# Patient Record
Sex: Female | Born: 1965 | Race: Black or African American | Hispanic: No | Marital: Single | Smoking: Never smoker
Health system: Southern US, Community
[De-identification: ages and names within clinical notes are randomized; demographics above are authoritative.]

## PROBLEM LIST (undated history)

## (undated) HISTORY — PX: KIDNEY STONE SURGERY: SHX686

---

## 2020-10-02 ENCOUNTER — Ambulatory Visit (INDEPENDENT_AMBULATORY_CARE_PROVIDER_SITE_OTHER): Payer: Self-pay | Admitting: Primary Care

## 2020-10-31 ENCOUNTER — Ambulatory Visit (INDEPENDENT_AMBULATORY_CARE_PROVIDER_SITE_OTHER): Payer: Self-pay | Admitting: Primary Care

## 2020-12-11 ENCOUNTER — Ambulatory Visit (INDEPENDENT_AMBULATORY_CARE_PROVIDER_SITE_OTHER): Payer: Self-pay

## 2020-12-11 ENCOUNTER — Ambulatory Visit (HOSPITAL_COMMUNITY)
Admission: EM | Admit: 2020-12-11 | Discharge: 2020-12-11 | Disposition: A | Discharge status: Home / Self Care | Payer: Self-pay | Attending: Family Medicine | Admitting: Family Medicine

## 2020-12-11 ENCOUNTER — Encounter (HOSPITAL_COMMUNITY): Payer: Self-pay

## 2020-12-11 ENCOUNTER — Other Ambulatory Visit: Payer: Self-pay

## 2020-12-11 DIAGNOSIS — M79605 Pain in left leg: Secondary | ICD-10-CM

## 2020-12-11 DIAGNOSIS — M79662 Pain in left lower leg: Secondary | ICD-10-CM

## 2020-12-11 MED ORDER — NAPROXEN 500 MG PO TABS: 500.0000 mg | ORAL_TABLET | Freq: Two times a day (BID) | ORAL | 0 refills | Status: DC | PRN
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
## 2021-01-11 ENCOUNTER — Emergency Department (HOSPITAL_COMMUNITY)
Admission: EM | Admit: 2021-01-11 | Discharge: 2021-01-12 | Disposition: A | Discharge status: Home / Self Care | Payer: 59 | Attending: Emergency Medicine | Admitting: Emergency Medicine

## 2021-01-11 ENCOUNTER — Emergency Department (HOSPITAL_COMMUNITY): Payer: 59

## 2021-01-11 ENCOUNTER — Other Ambulatory Visit: Payer: Self-pay

## 2021-01-11 DIAGNOSIS — M79671 Pain in right foot: Secondary | ICD-10-CM | POA: Diagnosis not present

## 2021-01-11 NOTE — ED Triage Notes (Signed)
Pt said her right foot on the bottom has been having a sore and a tender place that when she walk is bothering her. Pt said painful to walk. No redness noted.

## 2021-01-12 MED ORDER — CIPROFLOXACIN HCL 500 MG PO TABS: 500.0000 mg | ORAL_TABLET | Freq: Two times a day (BID) | ORAL | 0 refills | Status: DC

## 2021-01-12 MED ORDER — CEPHALEXIN 500 MG PO CAPS: 500.0000 mg | ORAL_CAPSULE | Freq: Four times a day (QID) | ORAL | 0 refills | Status: DC

## 2021-01-12 MED ORDER — BACITRACIN ZINC 500 UNIT/GM EX OINT: 1.0000 "application " | TOPICAL_OINTMENT | Freq: Two times a day (BID) | CUTANEOUS | 0 refills | Status: DC

## 2021-01-12 NOTE — ED Provider Notes (Signed)
Taylor Prince   CSN: [id number] Arrival date & time: 01/11/21  2017     History Chief Complaint  Patient presents with  . Foot Pain    Taylor Prince is a 55 y.o. female.   Foot Pain This is a new problem. The current episode started more than 2 days ago. The problem occurs constantly. The problem has not changed since onset.Pertinent negatives include no chest pain and no abdominal pain. Nothing aggravates the symptoms. Nothing relieves the symptoms. She has tried nothing for the symptoms.       No past medical history on file.  There are no problems to display for this patient.   No past surgical history on file.   OB History   No obstetric history on file.     No family history on file.  Social History   Tobacco Use  . Smoking status: Never Smoker  . Smokeless tobacco: Never Used  Substance Use Topics  . Alcohol use: Yes  . Drug use: Never    Home Medications Prior to Admission medications   Medication Sig Start Date End Date Taking? Authorizing Provider  bacitracin ointment Apply 1 application topically 2 (two) times daily. 01/12/21  Yes Lawrance Wiedemann, Barbara Cower, MD  cephALEXin (KEFLEX) 500 MG capsule Take 1 capsule (500 mg total) by mouth 4 (four) times daily. 01/12/21  Yes Antoinette Borgwardt, Barbara Cower, MD  ciprofloxacin (CIPRO) 500 MG tablet Take 1 tablet (500 mg total) by mouth 2 (two) times daily. 01/12/21  Yes Maryela Tapper, Barbara Cower, MD  naproxen (NAPROSYN) 500 MG tablet Take 1 tablet (500 mg total) by mouth 2 (two) times daily as needed. 12/11/20   Particia Nearing, PA-C    Allergies    Patient has no known allergies.  Review of Systems   Review of Systems  Cardiovascular: Negative for chest pain.  Gastrointestinal: Negative for abdominal pain.  All other systems reviewed and are negative.   Physical Exam Updated Vital Signs BP (!) 148/72 (BP Location: Left Arm)   Pulse 70   Temp 98 F (36.7 C) (Oral)   Resp 16    SpO2 100%   Physical Exam Vitals and nursing Prince reviewed.  Constitutional:      Appearance: She is well-developed and well-nourished.  HENT:     Head: Normocephalic and atraumatic.     Mouth/Throat:     Mouth: Mucous membranes are moist.     Pharynx: Oropharynx is clear.  Eyes:     Pupils: Pupils are equal, round, and reactive to light.  Cardiovascular:     Rate and Rhythm: Normal rate and regular rhythm.  Pulmonary:     Effort: No respiratory distress.     Breath sounds: No stridor.  Abdominal:     General: There is no distension.  Musculoskeletal:        General: Tenderness (callous to right foot with questionable surrounding cellulitis) present. No swelling. Normal range of motion.     Cervical back: Normal range of motion.  Skin:    General: Skin is warm and dry.  Neurological:     General: No focal deficit present.     Mental Status: She is alert.     ED Results / Procedures / Treatments   Labs (all labs ordered are listed, but only abnormal results are displayed) Labs Reviewed - No data to display  EKG None  Radiology DG Foot 2 Views Right  Result Date: 01/11/2021 CLINICAL DATA:  Lateral right foot pain.  Ulcer. EXAM: RIGHT FOOT - 2 VIEW COMPARISON:  None. FINDINGS: Degenerative changes in the first metatarsal-phalangeal joint. No evidence of acute fracture or dislocation. No focal bone lesions. Soft tissues are unremarkable. No radiopaque foreign body or soft tissue gas identified. IMPRESSION: Degenerative changes in the first metatarsal-phalangeal joint. No acute bony abnormalities. Electronically Signed   By: Burman Nieves M.D.   On: 01/11/2021 21:28    Procedures Procedures (including critical care time)  Medications Ordered in ED Medications - No data to display  ED Course  I have reviewed the triage vital signs and the nursing notes.  Pertinent labs & imaging results that were available during my care of the patient were reviewed by me and  considered in my medical decision making (see chart for details).    MDM Rules/Calculators/A&P                          Treated for possibly infection. No obvious drainable abscess.   Final Clinical Impression(s) / ED Diagnoses Final diagnoses:  Right foot pain    Rx / DC Orders ED Discharge Orders         Ordered    ciprofloxacin (CIPRO) 500 MG tablet  2 times daily        01/12/21 0422    cephALEXin (KEFLEX) 500 MG capsule  4 times daily        01/12/21 0422    bacitracin ointment  2 times daily        01/12/21 0423           Tesneem Dufrane, Barbara Cower, MD 01/12/21 [phone]

## 2021-02-23 ENCOUNTER — Other Ambulatory Visit: Payer: Self-pay

## 2021-02-23 ENCOUNTER — Ambulatory Visit (INDEPENDENT_AMBULATORY_CARE_PROVIDER_SITE_OTHER): Payer: 59 | Admitting: Primary Care

## 2021-02-23 ENCOUNTER — Encounter (INDEPENDENT_AMBULATORY_CARE_PROVIDER_SITE_OTHER): Payer: Self-pay | Admitting: Primary Care

## 2021-02-23 VITALS — BP 144/89 | HR 85 | Temp 97.7°F | Ht <= 58 in | Wt 122.8 lb

## 2021-02-23 DIAGNOSIS — I10 Essential (primary) hypertension: Secondary | ICD-10-CM

## 2021-02-23 DIAGNOSIS — Z1211 Encounter for screening for malignant neoplasm of colon: Secondary | ICD-10-CM

## 2021-02-23 DIAGNOSIS — L98491 Non-pressure chronic ulcer of skin of other sites limited to breakdown of skin: Secondary | ICD-10-CM

## 2021-02-23 DIAGNOSIS — Z7689 Persons encountering health services in other specified circumstances: Secondary | ICD-10-CM | POA: Diagnosis not present

## 2021-02-23 DIAGNOSIS — Z13220 Encounter for screening for lipoid disorders: Secondary | ICD-10-CM

## 2021-02-23 DIAGNOSIS — Z23 Encounter for immunization: Secondary | ICD-10-CM

## 2021-02-23 DIAGNOSIS — F17200 Nicotine dependence, unspecified, uncomplicated: Secondary | ICD-10-CM | POA: Diagnosis not present

## 2021-02-23 NOTE — Progress Notes (Signed)
New Patient Office Visit  Subjective:  Patient ID: Taylor Prince, female    DOB: [birth year]  Age: 55 y.o. MRN: [id number]  CC:  Chief Complaint  Patient presents with  . New Patient (Initial Visit)    Sore on side of right foot would like podiatry referral     HPI [name] is a 55 year old female who  presents for establishment of care. Requesting a podiatry appointment she has a dime size discolored area that hurts and aches when walking.    No past medical history on file.  No past surgical history on file.  No family history on file.  Social History   Socioeconomic History  . Marital status: Single    Spouse name: Not on file  . Number of children: Not on file  . Years of education: Not on file  . Highest education level: Not on file  Occupational History  . Not on file  Tobacco Use  . Smoking status: Never Smoker  . Smokeless tobacco: Never Used  Substance and Sexual Activity  . Alcohol use: Yes  . Drug use: Never  . Sexual activity: Not on file  Other Topics Concern  . Not on file  Social History Narrative  . Not on file   Social Determinants of Health   Financial Resource Strain: Not on file  Food Insecurity: Not on file  Transportation Needs: Not on file  Physical Activity: Not on file  Stress: Not on file  Social Connections: Not on file  Intimate Partner Violence: Not on file    ROS Review of Systems Pertinent positive and negative noted in HPI  Objective:   Today's Vitals: BP (!) 144/89 (BP Location: Right Arm, Patient Position: Sitting, Cuff Size: Normal)   Pulse 85   Temp 97.7 F (36.5 C) (Temporal)   Ht 4\' 8"  (1.422 m)   Wt 122 lb 12.8 oz (55.7 kg)   SpO2 98%   BMI 27.53 kg/m   Physical Exam Vitals reviewed.  Constitutional:      Appearance: Normal appearance.  HENT:     Head: Normocephalic.     Right Ear: Tympanic membrane normal.     Left Ear: Tympanic membrane normal.     Nose: Nose normal.  Eyes:     Pupils:  Pupils are equal, round, and reactive to light.  Cardiovascular:     Rate and Rhythm: Normal rate and regular rhythm.  Pulmonary:     Effort: Pulmonary effort is normal.     Breath sounds: Normal breath sounds.  Abdominal:     General: Bowel sounds are normal.  Musculoskeletal:        General: Normal range of motion.     Cervical back: Normal range of motion and neck supple.  Skin:    General: Skin is warm.     Comments: Dime size discolored area callous on the right foot below little toe   Neurological:     Mental Status: She is alert and oriented to person, place, and time.  Psychiatric:        Mood and Affect: Mood normal.        Behavior: Behavior normal.        Thought Content: Thought content normal.        Judgment: Judgment normal.     Assessment & Plan:  Taylor Prince was seen today for new patient (initial visit).  Diagnoses and all orders for this visit:  Need for Tdap vaccination -  Tdap vaccine greater than or equal to 7yo IM  Elevated blood pressure reading in office with diagnosis of hypertension States never had elevated BP. States probably due to having a cigarette before appointment. Expected Bp is 130/80, low-sodium, DASH diet, medication compliance, 150 minutes of moderate intensity exercise per week. Monitors Bp at home systolic 120-140 and diastolic 70-88.  Tobacco dependence Aware of risk of cigarette use.   Increased risk for lung cancer, HTN and other respiratory diseases recommend cessation. She is weaning her self off states 1 pack of cigarettes last 2 weeks .This will be reminded at each clinical visit.  Encounter to establish care Establish care with new PCP  Colon cancer screening Normal colon cancer screening.  CDC recommends colorectal screening from ages 64-75.      Ambulatory referral to Gastroenterology  Lipid screening -     Lipid panel; Future  Callous ulcer, limited to breakdown of skin (HCC) Healed area discolored size of a dime  c/o discomfort  -     Ambulatory referral to Podiatry   Outpatient Encounter Medications as of 02/23/2021  Medication Sig  . [DISCONTINUED] bacitracin ointment Apply 1 application topically 2 (two) times daily.  . [DISCONTINUED] cephALEXin (KEFLEX) 500 MG capsule Take 1 capsule (500 mg total) by mouth 4 (four) times daily.  . [DISCONTINUED] ciprofloxacin (CIPRO) 500 MG tablet Take 1 tablet (500 mg total) by mouth 2 (two) times daily.  . [DISCONTINUED] naproxen (NAPROSYN) 500 MG tablet Take 1 tablet (500 mg total) by mouth 2 (two) times daily as needed.   No facility-administered encounter medications on file as of 02/23/2021.    Follow-up: Return for schedule pap.   Grayce Sessions, NP

## 2021-02-23 NOTE — Patient Instructions (Signed)

## 2021-02-26 ENCOUNTER — Ambulatory Visit (INDEPENDENT_AMBULATORY_CARE_PROVIDER_SITE_OTHER): Payer: 59

## 2021-02-26 ENCOUNTER — Ambulatory Visit (INDEPENDENT_AMBULATORY_CARE_PROVIDER_SITE_OTHER): Payer: 59 | Admitting: Podiatry

## 2021-02-26 ENCOUNTER — Other Ambulatory Visit: Payer: Self-pay

## 2021-02-26 DIAGNOSIS — M21611 Bunion of right foot: Secondary | ICD-10-CM

## 2021-02-26 DIAGNOSIS — M19071 Primary osteoarthritis, right ankle and foot: Secondary | ICD-10-CM

## 2021-02-26 DIAGNOSIS — L84 Corns and callosities: Secondary | ICD-10-CM

## 2021-02-26 DIAGNOSIS — M2011 Hallux valgus (acquired), right foot: Secondary | ICD-10-CM

## 2021-02-26 DIAGNOSIS — F1290 Cannabis use, unspecified, uncomplicated: Secondary | ICD-10-CM

## 2021-02-26 DIAGNOSIS — R52 Pain, unspecified: Secondary | ICD-10-CM

## 2021-02-27 ENCOUNTER — Encounter: Payer: Self-pay | Admitting: Podiatry

## 2021-02-27 NOTE — Progress Notes (Signed)
Subjective:  Patient ID: Taylor Prince, female    DOB: [birth year],  MRN: [id number]  Chief Complaint  Patient presents with  . Callouses    Right foot callus on the side of 5th toe causing discomfort     55 y.o. female presents with the above complaint. History confirmed with patient.  She also complains of a large bump on the big toe with pain around the joint  Objective:  Physical Exam: warm, good capillary refill, no trophic changes or ulcerative lesions, normal DP and PT pulses and normal sensory exam.   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  Hyperkeratotic lesion plantar fifth metatarsal  .  Radiographs: X-ray of the right foot: Large dorsal osteophytosis on the metatarsal head and proximal phalanx of the first MTPJ, she has a tailor's bunion as well Assessment:   1. Hallux valgus with bunions, right   2. Callus of foot   3. Pain   4. Osteoarthritis of first metatarsophalangeal (MTP) joint of right foot      Plan:  Patient was evaluated and treated and all questions answered.  All symptomatic hyperkeratoses were safely debrided with a sterile #15 blade to patient's level of comfort without incident. We discussed preventative and palliative care of these lesions including supportive and accommodative shoegear, padding, prefabricated and custom molded accommodative orthoses, use of a pumice stone and lotions/creams daily.  I reviewed the radiographic and clinical exam findings with the patient and her husband.  Discussed with her that the large dorsal osteophyte is likely secondary to hallux limitus and osteoarthritis.  We discussed treatment for this including surgical and nonsurgical, injection therapy, rigid orthoses and shoe gear modification.  Advised to avoid wearing tight shoes.  She  has tried this before and is interested in surgical correction.  She does not want to try an injection today.  I discussed with her that treatment option surgically would include cheilectomy versus first metatarsal phalangeal joint arthrodesis.  I do not think she would be a good candidate for implant arthroplasty at her age and activity level.  She would strongly like to avoid arthrodesis and prolonged recovery at this time.  She has elected for cheilectomy of the first metatarsophalangeal joint through a minimally invasive approach.  I discussed with her that minimally invasive surgery is not without risks and the risk of infection and neurovascular injury is still quite significant.  She understands the risks and would like to proceed with surgery.  I also discussed with her that in cases of hallux limitus with cheilectomy often the dorsal cheilus is preventing range of motion which can limit her pain, and removing this can increase range of motion but may lead to increased pain in the joint if there is arthritic changes within the cartilage.  She understands this and would like to proceed knowing that it is likely not a definitive procedure.  Informed consent was signed and reviewed in the office.   Surgical plan:  Procedure: -Right foot cheilectomy with minimally invasive approach  Location: -[facility]  Anesthesia plan: -IV sedation with local anesthesia with epinephrine, will plan to do this without tourniquet  Postoperative pain plan: - Tylenol 1000 mg every 6 hours, ibuprofen 600 mg every 6 hours, gabapentin 300 mg every 8 hours x5 days, oxycodone 5 mg 1-2 tabs every 6 hours only  as needed  DVT prophylaxis: -None required should be WBAT in a surgical shoe  WB Restrictions / DME needs: -Surgical shoe will be dispensed at the surgical center   No follow-ups on file.

## 2021-03-09 ENCOUNTER — Telehealth: Payer: Self-pay | Admitting: Urology

## 2021-03-09 NOTE — Telephone Encounter (Signed)
DOS:03/20/21  CHEILECTOMY RIGHT --- [id number]  RECEIVED AUTH FAX FROM [facility]. AUTH # [id number] AND IS GOOD FOR 03/20/21 ONLY

## 2021-03-13 ENCOUNTER — Other Ambulatory Visit (HOSPITAL_COMMUNITY)
Admission: RE | Admit: 2021-03-13 | Discharge: 2021-03-13 | Disposition: A | Discharge status: Home / Self Care | Payer: 59 | Source: Ambulatory Visit | Attending: Primary Care | Admitting: Primary Care

## 2021-03-13 ENCOUNTER — Other Ambulatory Visit: Payer: Self-pay

## 2021-03-13 ENCOUNTER — Encounter (INDEPENDENT_AMBULATORY_CARE_PROVIDER_SITE_OTHER): Payer: Self-pay | Admitting: Primary Care

## 2021-03-13 ENCOUNTER — Ambulatory Visit (INDEPENDENT_AMBULATORY_CARE_PROVIDER_SITE_OTHER): Payer: 59 | Admitting: Primary Care

## 2021-03-13 VITALS — BP 130/79 | HR 57 | Temp 97.3°F | Ht <= 58 in

## 2021-03-13 DIAGNOSIS — Z113 Encounter for screening for infections with a predominantly sexual mode of transmission: Secondary | ICD-10-CM

## 2021-03-13 DIAGNOSIS — Z124 Encounter for screening for malignant neoplasm of cervix: Secondary | ICD-10-CM | POA: Diagnosis not present

## 2021-03-13 DIAGNOSIS — Z1211 Encounter for screening for malignant neoplasm of colon: Secondary | ICD-10-CM | POA: Diagnosis not present

## 2021-03-13 DIAGNOSIS — Z1231 Encounter for screening mammogram for malignant neoplasm of breast: Secondary | ICD-10-CM

## 2021-03-15 NOTE — Progress Notes (Signed)
[facility]   WELL-WOMAN PHYSICAL & PAP Patient name: Taylor Prince MRN [id number]  Date of birth: 1966/08/24 Chief Complaint:   Gynecologic Exam  History of Present Illness:   Taylor Prince is a 55 y.o. No obstetric history on file. African American female being seen today for a routine well-woman exam.  Current complaints: none  PCP: Grayce Sessions does not desire labs No LMP recorded. Patient is postmenopausal. The current method of family planning is none.   Last mammogram: None. Family h/o breast cancer: No Last colonoscopy: none ordered  Results were: Marland Kitchen Family h/o colorectal cancer: No Review of Systems:   Pertinent items are noted in HPI Denies any headaches, blurred vision, fatigue, shortness of breath, chest pain, abdominal pain, abnormal vaginal discharge/itching/odor/irritation, problems with periods, bowel movements, urination, or intercourse unless otherwise stated above. Pertinent History Reviewed:  Reviewed past medical,surgical, social and family history.  Reviewed problem list, medications and allergies. Physical Assessment:   Vitals:   03/13/21 1041  BP: 130/79  Pulse: (!) 57  Temp: (!) 97.3 F (36.3 C)  TempSrc: Temporal  SpO2: 99%  Height: 4\' 8"  (1.422 m)  Body mass index is 27.53 kg/m.        Physical Examination:   General appearance - well appearing, and in no distress  Mental status - alert, oriented to person, place, and time  Psych:  She has a normal mood and affect  Skin - warm and dry, normal color, no suspicious lesions noted  Chest - effort normal, all lung fields clear to auscultation bilaterally  Heart - normal rate and regular rhythm  Neck:  midline trachea, no thyromegaly or nodules  Breasts -Taught SBE and return demonstration   Abdomen - soft, nontender, nondistended, no masses or organomegaly  Pelvic - VULVA: normal appearing vulva with no masses, tenderness or lesions  VAGINA: normal appearing vagina with normal  color and discharge, no lesions   CERVIX: normal appearing cervix without discharge or lesions, no CMT  Thin prep pap is done  UTERUS: uterus is felt to be normal size, shape, consistency and nontender   ADNEXA: No adnexal masses or tenderness noted.  Extremities:  No swelling or varicosities noted  No results found for this or any previous visit (from the past 24 hour(s)).  Assessment & Plan:  1) Well-Woman Exam with Pap Cervical cancer screening -     Cytology - PAP(Bacon)  2) Screening examination for STD (sexually transmitted disease) -     Cervicovaginal ancillary only  Encounter for screening mammogram for malignant neoplasm of breast -     MM Digital Screening; Future  Colon cancer screening -     Ambulatory referral to Gastroenterology  Meds: No orders of the defined types were placed in this encounter.   Follow-up: No follow-ups on file.  Edwards3/27/2022 10:42 PM

## 2021-03-16 ENCOUNTER — Other Ambulatory Visit (INDEPENDENT_AMBULATORY_CARE_PROVIDER_SITE_OTHER): Payer: Self-pay | Admitting: Primary Care

## 2021-03-16 DIAGNOSIS — B9689 Other specified bacterial agents as the cause of diseases classified elsewhere: Secondary | ICD-10-CM

## 2021-03-16 DIAGNOSIS — B379 Candidiasis, unspecified: Secondary | ICD-10-CM

## 2021-03-16 DIAGNOSIS — A599 Trichomoniasis, unspecified: Secondary | ICD-10-CM

## 2021-03-16 LAB — CERVICOVAGINAL ANCILLARY ONLY
Bacterial Vaginitis (gardnerella): POSITIVE — AB
Candida Glabrata: POSITIVE — AB
Candida Vaginitis: NEGATIVE
Chlamydia: NEGATIVE
Comment: NEGATIVE
Comment: NEGATIVE
Comment: NEGATIVE
Comment: NEGATIVE
Comment: NEGATIVE
Comment: NORMAL
Neisseria Gonorrhea: NEGATIVE
Trichomonas: POSITIVE — AB

## 2021-03-16 MED ORDER — METRONIDAZOLE 500 MG PO TABS: 500.0000 mg | ORAL_TABLET | Freq: Two times a day (BID) | ORAL | 0 refills | Status: DC

## 2021-03-16 MED ORDER — FLUCONAZOLE 150 MG PO TABS: 150.0000 mg | ORAL_TABLET | Freq: Once | ORAL | 0 refills | Status: AC

## 2021-03-17 LAB — CYTOLOGY - PAP
Adequacy: ABSENT
Diagnosis: NEGATIVE

## 2021-03-18 ENCOUNTER — Telehealth (INDEPENDENT_AMBULATORY_CARE_PROVIDER_SITE_OTHER): Payer: Self-pay

## 2021-03-18 NOTE — Telephone Encounter (Signed)
-----   Message from Grayce Sessions, NP sent at 03/16/2021  9:32 PM EDT ----- Patient has Taylor Prince, bacterial vaginosis and yeast treatment sent to the pharmacy

## 2021-03-18 NOTE — Telephone Encounter (Signed)
Patient verified date of birth. She is aware of trichomonas, yeast and bacteria vaginosis. Medication for treatment sent to pharmacy. Advised patient to avoid alcohol while taking medication. She verbalized understanding. Maryjean Morn, CMA

## 2021-03-20 ENCOUNTER — Other Ambulatory Visit: Payer: Self-pay | Admitting: Podiatry

## 2021-03-20 DIAGNOSIS — M2021 Hallux rigidus, right foot: Secondary | ICD-10-CM | POA: Diagnosis not present

## 2021-03-20 DIAGNOSIS — M19271 Secondary osteoarthritis, right ankle and foot: Secondary | ICD-10-CM | POA: Diagnosis not present

## 2021-03-20 MED ORDER — ACETAMINOPHEN 500 MG PO TABS: 1000.0000 mg | ORAL_TABLET | Freq: Four times a day (QID) | ORAL | 0 refills | Status: AC | PRN

## 2021-03-20 MED ORDER — OXYCODONE HCL 5 MG PO TABS: 5.0000 mg | ORAL_TABLET | ORAL | 0 refills | Status: AC | PRN

## 2021-03-20 MED ORDER — IBUPROFEN 600 MG PO TABS: 600.0000 mg | ORAL_TABLET | Freq: Four times a day (QID) | ORAL | 0 refills | Status: AC | PRN

## 2021-03-20 NOTE — Progress Notes (Addendum)
4/1 gssc cheilectomy 1st MTP right foot

## 2021-03-23 ENCOUNTER — Telehealth: Payer: Self-pay | Admitting: Podiatry

## 2021-03-23 NOTE — Telephone Encounter (Signed)
Patient called in today stating she would like another medication she states she is in pain and her feet are throbbing.

## 2021-03-26 ENCOUNTER — Ambulatory Visit (INDEPENDENT_AMBULATORY_CARE_PROVIDER_SITE_OTHER): Payer: 59 | Admitting: Podiatry

## 2021-03-26 ENCOUNTER — Encounter: Payer: Self-pay | Admitting: Podiatry

## 2021-03-26 ENCOUNTER — Ambulatory Visit (INDEPENDENT_AMBULATORY_CARE_PROVIDER_SITE_OTHER): Payer: 59

## 2021-03-26 ENCOUNTER — Other Ambulatory Visit: Payer: Self-pay

## 2021-03-26 DIAGNOSIS — M19071 Primary osteoarthritis, right ankle and foot: Secondary | ICD-10-CM

## 2021-03-26 NOTE — Progress Notes (Signed)
  Subjective:  Patient ID: Taylor Prince, female    DOB: 1966/12/05,  MRN: [id number]  Chief Complaint  Patient presents with  . Foot Pain    PT stated that she has a burning sensation in her foot     DOS: 03/20/2021 Procedure: Cheilectomy right first metatarsophalangeal joint  55 y.o. female returns for post-op check.  She is doing well, she is not having much pain.  She did not go back to work  Review of Systems: Negative except as noted in the HPI. Denies N/V/F/Ch.   Objective:  There were no vitals filed for this visit. There is no height or weight on file to calculate BMI. Constitutional Well developed. Well nourished.  Vascular Foot warm and well perfused. Capillary refill normal to all digits.   Neurologic Normal speech. Oriented to person, place, and time. Epicritic sensation to light touch grossly present bilaterally.  Dermatologic Skin healing well without signs of infection. Skin edges well coapted without signs of infection.  Orthopedic:  Mild edema and tenderness to palpation noted about the surgical site.   Radiographs: Successful cheilectomy first metatarsophalangeal joint Assessment:   1. Osteoarthritis of first metatarsophalangeal (MTP) joint of right foot    Plan:  Patient was evaluated and treated and all questions answered.  S/p foot surgery right -Progressing as expected post-operatively. -XR: As above -WB Status: WBAT in regular shoes -Sutures: We will remove next week. -Medications: No refills required -Foot redressed.  She may begin bathing on Monday  Return in about 1 week (around 04/02/2021) for suture removal.

## 2021-04-02 ENCOUNTER — Encounter: Payer: Self-pay | Admitting: Podiatry

## 2021-04-02 ENCOUNTER — Other Ambulatory Visit: Payer: Self-pay

## 2021-04-02 ENCOUNTER — Ambulatory Visit (INDEPENDENT_AMBULATORY_CARE_PROVIDER_SITE_OTHER): Payer: 59 | Admitting: Podiatry

## 2021-04-02 DIAGNOSIS — L401 Generalized pustular psoriasis: Secondary | ICD-10-CM | POA: Diagnosis not present

## 2021-04-02 DIAGNOSIS — M19071 Primary osteoarthritis, right ankle and foot: Secondary | ICD-10-CM

## 2021-04-02 MED ORDER — BETAMETHASONE DIPROPIONATE AUG 0.05 % EX OINT: TOPICAL_OINTMENT | Freq: Every day | CUTANEOUS | 0 refills | Status: DC

## 2021-04-02 NOTE — Progress Notes (Signed)
  Subjective:  Patient ID: Taylor Prince, female    DOB: September 09, 1966,  MRN: [id number]  Chief Complaint  Patient presents with  . Routine Post Op      SUTURE REMOVAL POV#2 DOS 03/20/21  REMOVAL OF SPURS AROUND BIG TOE JOINT    DOS: 03/20/2021 Procedure: Cheilectomy right first metatarsophalangeal joint  55 y.o. female returns for post-op check.  Doing well having some pain.  Overall improving.  She has a new rash on the inside of the ankle  Review of Systems: Negative except as noted in the HPI. Denies N/V/F/Ch.   Objective:  There were no vitals filed for this visit. There is no height or weight on file to calculate BMI. Constitutional Well developed. Well nourished.  Vascular Foot warm and well perfused. Capillary refill normal to all digits.   Neurologic Normal speech. Oriented to person, place, and time. Epicritic sensation to light touch grossly present bilaterally.  Dermatologic Skin healing well without signs of infection. Skin edges well coapted without signs of infection.  Pruritic scaly rash with small pustules medial ankle  Orthopedic:  Mild edema and tenderness to palpation noted about the surgical site.   Radiographs: Successful cheilectomy first metatarsophalangeal joint Assessment:   1. 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  S/p foot surgery right -Progressing as expected post-operatively. -XR: As above -WB Status: WBAT in regular shoes -Sutures: Removed today   She had a new issue of rash on the right anterior ankle appear to be consistent with pustular psoriasis I prescribed her Diprolene to use until it resolves  No follow-ups on file.

## 2021-04-14 ENCOUNTER — Encounter: Payer: Self-pay | Admitting: Nurse Practitioner

## 2021-04-16 ENCOUNTER — Encounter: Payer: 59 | Admitting: Podiatry

## 2021-05-21 ENCOUNTER — Encounter: Payer: Self-pay | Admitting: Nurse Practitioner

## 2021-05-21 ENCOUNTER — Ambulatory Visit (INDEPENDENT_AMBULATORY_CARE_PROVIDER_SITE_OTHER): Payer: 59 | Admitting: Nurse Practitioner

## 2021-05-21 VITALS — BP 126/70 | HR 82 | Ht <= 58 in | Wt 121.8 lb

## 2021-05-21 DIAGNOSIS — Z1211 Encounter for screening for malignant neoplasm of colon: Secondary | ICD-10-CM

## 2021-05-21 DIAGNOSIS — Z800 Family history of malignant neoplasm of digestive organs: Secondary | ICD-10-CM

## 2021-05-21 MED ORDER — PEG 3350-KCL-NA BICARB-NACL 420 G PO SOLR: 4000.0000 mL | Freq: Once | ORAL | 0 refills | Status: AC

## 2021-05-21 NOTE — Progress Notes (Signed)
Agree with assessment and plan as outlined.  

## 2021-05-21 NOTE — Progress Notes (Signed)
     ASSESSMENT AND PLAN    # 55 yo female for colon cancer screening. She has a [facility] of colon cancer in mother in her 23's.  She had an isolated episode of rectal bleeding last week but in setting of constipation I suspect anorectal source of bleeding such as hemorrhoids.  --Patient will be scheduled for a screening colonoscopy. The risks and benefits of colonoscopy with possible polypectomy / biopsies were discussed and the patient agrees to proceed.   HISTORY OF PRESENT ILLNESS     Chief Complaint : colon cancer screening.   Taylor Prince is a 55 y.o. female , new to the practice and referred by PCP for colon cancer screening. Her mother had colon cancer in her 66's. Patient has no significant past medical history .    Patient had a small amount of blood in stool last week, an isolated occurrence. She was constipated at the time. She isn't typically constipated. She has no GI or general medical complaints. She doesn't take any medications.   PREVIOUS EVALUATIONS:   none  Past Surgical History:  Procedure Laterality Date  . CESAREAN SECTION    . KIDNEY STONE SURGERY     Family History  Problem Relation Age of Onset  . Colon cancer Mother   . Colon polyps Mother   . Kidney disease Mother    Social History   Tobacco Use  . Smoking status: Never Smoker  . Smokeless tobacco: Never Used  Substance Use Topics  . Alcohol use: Yes  . Drug use: Yes    Types: Marijuana    Comment: Denies use with tobacco   No current outpatient medications on file.   No current facility-administered medications for this visit.   No Known Allergies   Review of Systems: All systems reviewed and negative except where noted in HPI.    PHYSICAL EXAM :    Wt Readings from Last 3 Encounters:  05/21/21 121 lb 12.8 oz (55.2 kg)  02/23/21 122 lb 12.8 oz (55.7 kg)    Ht 4\' 8"  (1.422 m)   Wt 121 lb 12.8 oz (55.2 kg)   BMI 27.31 kg/m  Constitutional:  Pleasant female in no acute  distress. Psychiatric: Normal mood and affect. Behavior is normal. EENT: Pupils normal.  Conjunctivae are normal. No scleral icterus. Neck supple.  Cardiovascular: Normal rate, regular rhythm. No edema Pulmonary/chest: Effort normal and breath sounds normal. No wheezing, rales or rhonchi. Abdominal: Soft, nondistended, nontender. Bowel sounds active throughout. There are no masses palpable. No hepatomegaly. Neurological: Alert and oriented to person place and time. Skin: Skin is warm and dry. No rashes noted.  , NP  05/21/2021, 8:41 AM  Cc:  Referring Provider 07/21/2021, NP

## 2021-05-21 NOTE — Patient Instructions (Signed)
You have been scheduled for a colonoscopy. Please follow written instructions given to you at your visit today.  Please pick up your prep supplies at the pharmacy within the next 1-3 days. If you use inhalers (even only as needed), please bring them with you on the day of your procedure.  If you are age 55 or older, your body mass index should be between 23-30. Your Body mass index is 27.31 kg/m. If this is out of the aforementioned range listed, please consider follow up with your Primary Care Provider.  If you are age 37 or younger, your body mass index should be between 19-25. Your Body mass index is 27.31 kg/m. If this is out of the aformentioned range listed, please consider follow up with your Primary Care Provider.   __________________________________________________________  The [facility] providers would like to encourage you to use [facility] to communicate with providers for non-urgent requests or questions.  Due to long hold times on the telephone, sending your provider a message by [facility] may be a faster and more efficient way to get a response.  Please allow 48 business hours for a response.  Please remember that this is for non-urgent requests.

## 2021-06-08 ENCOUNTER — Encounter: Payer: 59 | Admitting: Podiatry

## 2021-06-15 ENCOUNTER — Telehealth: Payer: Self-pay | Admitting: Gastroenterology

## 2021-06-15 NOTE — Telephone Encounter (Signed)
Okay thanks for letting me know

## 2021-06-15 NOTE — Telephone Encounter (Signed)
Hey Dr. Adela Lank,   Inbound call from patient to cancel procedure 6/29. States have personal issues and unable to make it. Patient rescheduled for 9/15.  Thank you

## 2021-06-17 ENCOUNTER — Encounter: Payer: 59 | Admitting: Gastroenterology

## 2021-07-17 ENCOUNTER — Ambulatory Visit
Admission: RE | Admit: 2021-07-17 | Discharge: 2021-07-17 | Disposition: A | Discharge status: Home / Self Care | Payer: 59 | Source: Ambulatory Visit | Attending: Primary Care | Admitting: Primary Care

## 2021-07-17 ENCOUNTER — Other Ambulatory Visit: Payer: Self-pay

## 2021-07-17 DIAGNOSIS — Z1231 Encounter for screening mammogram for malignant neoplasm of breast: Secondary | ICD-10-CM

## 2021-07-29 ENCOUNTER — Other Ambulatory Visit: Payer: Self-pay | Admitting: Primary Care

## 2021-07-29 DIAGNOSIS — R928 Other abnormal and inconclusive findings on diagnostic imaging of breast: Secondary | ICD-10-CM

## 2021-08-06 ENCOUNTER — Encounter (INDEPENDENT_AMBULATORY_CARE_PROVIDER_SITE_OTHER): Payer: Self-pay

## 2021-08-14 ENCOUNTER — Ambulatory Visit
Admission: RE | Admit: 2021-08-14 | Discharge: 2021-08-14 | Disposition: A | Discharge status: Home / Self Care | Payer: 59 | Source: Ambulatory Visit | Attending: Primary Care | Admitting: Primary Care

## 2021-08-14 ENCOUNTER — Other Ambulatory Visit: Payer: Self-pay

## 2021-08-14 ENCOUNTER — Ambulatory Visit: Payer: 59

## 2021-08-14 ENCOUNTER — Other Ambulatory Visit: Payer: Self-pay | Admitting: Primary Care

## 2021-08-14 DIAGNOSIS — R928 Other abnormal and inconclusive findings on diagnostic imaging of breast: Secondary | ICD-10-CM

## 2021-08-17 ENCOUNTER — Other Ambulatory Visit: Payer: Self-pay | Admitting: Diagnostic Radiology

## 2021-08-17 ENCOUNTER — Ambulatory Visit
Admission: RE | Admit: 2021-08-17 | Discharge: 2021-08-17 | Disposition: A | Discharge status: Home / Self Care | Payer: 59 | Source: Ambulatory Visit | Attending: Primary Care | Admitting: Primary Care

## 2021-08-17 ENCOUNTER — Other Ambulatory Visit: Payer: Self-pay

## 2021-08-17 DIAGNOSIS — R928 Other abnormal and inconclusive findings on diagnostic imaging of breast: Secondary | ICD-10-CM

## 2021-09-01 ENCOUNTER — Telehealth: Payer: Self-pay

## 2021-09-01 NOTE — Telephone Encounter (Signed)
This is the second time the patient has cancelled her procedure. There is still time to pick up the script for her prep or to do an OTC prep like Miralax. She can still proceed if she is willing to pick up the prep.

## 2021-09-01 NOTE — Telephone Encounter (Signed)
Hi Dr. Adela Lank,  This patient cancelled her procedure on 09/03/21 at 0930. She stated that she was unable to pick up prescription for prep. I rescheduled her procedure to 10/14/21, with a pre-visit scheduled for 09/23/21.

## 2021-09-03 ENCOUNTER — Encounter: Payer: 59 | Admitting: Gastroenterology

## 2021-09-23 ENCOUNTER — Ambulatory Visit (AMBULATORY_SURGERY_CENTER): Payer: 59

## 2021-09-23 ENCOUNTER — Other Ambulatory Visit: Payer: Self-pay

## 2021-09-23 VITALS — Ht <= 58 in | Wt 126.0 lb

## 2021-09-23 DIAGNOSIS — Z800 Family history of malignant neoplasm of digestive organs: Secondary | ICD-10-CM

## 2021-09-23 DIAGNOSIS — Z1211 Encounter for screening for malignant neoplasm of colon: Secondary | ICD-10-CM

## 2021-09-23 MED ORDER — NA SULFATE-K SULFATE-MG SULF 17.5-3.13-1.6 GM/177ML PO SOLN: 1.0000 | Freq: Once | ORAL | 0 refills | Status: AC

## 2021-09-23 NOTE — Progress Notes (Signed)
   Patient's pre-visit was done today over the phone with the patient   Name,DOB and address verified.   Patient denies any allergies to Eggs and Soy.  Patient denies any problems with anesthesia/sedation. Patient denies taking diet pills or blood thinners.  Denies atrial flutter or atrial fib Denies chronic constipation No home Oxygen.   Packet of Prep instructions mailed to patient including a copy of a consent form-pt is aware.  Patient understands to call us back with any questions or concerns.  Patient is aware of our care-partner policy and Covid-19 safety protocol.   EMMI education assigned to the patient for the procedure, sent to MyChart.   The patient is COVID-19 vaccinated.     Pt denies any medications by rx or OTC; also denies any medical problems  Denies having any prep at home.

## 2021-10-14 ENCOUNTER — Encounter: Payer: 59 | Admitting: Gastroenterology

## 2021-10-14 ENCOUNTER — Telehealth: Payer: Self-pay | Admitting: Gastroenterology

## 2021-10-14 NOTE — Telephone Encounter (Signed)
Hi there, just forwarding this along, can you follow this up to see that the patient is charged the no show fee for no advanced notice for cancellation. Thanks

## 2021-10-14 NOTE — Telephone Encounter (Signed)
Good Morning Dr. Adela Lank,  Called patient at 7:15  she stated she was not able to make appointment today.  Stated she would call back to reschedule.

## 2021-10-14 NOTE — Telephone Encounter (Signed)
Can you please clarify why the patient was not able to make the appointment. She gave [place] no notice. Should be charged cancellation fee if no valid excuse

## 2021-10-14 NOTE — Telephone Encounter (Signed)
Patient was very short with not excuse just that she was not going to be able to come. After I asked her if she wanted to rescheduled she declined and stated she would call back and hung up.

## 2022-06-11 ENCOUNTER — Other Ambulatory Visit: Payer: Self-pay | Admitting: Primary Care

## 2022-06-11 DIAGNOSIS — Z1231 Encounter for screening mammogram for malignant neoplasm of breast: Secondary | ICD-10-CM

## 2022-06-17 ENCOUNTER — Encounter (INDEPENDENT_AMBULATORY_CARE_PROVIDER_SITE_OTHER): Payer: Self-pay | Admitting: Primary Care

## 2022-06-17 ENCOUNTER — Ambulatory Visit (INDEPENDENT_AMBULATORY_CARE_PROVIDER_SITE_OTHER): Payer: Self-pay | Admitting: Primary Care

## 2022-06-17 VITALS — BP 113/79 | HR 93 | Temp 98.0°F | Ht <= 58 in | Wt 116.6 lb

## 2022-06-17 DIAGNOSIS — Z0000 Encounter for general adult medical examination without abnormal findings: Secondary | ICD-10-CM

## 2022-06-17 NOTE — Patient Instructions (Signed)
Recombinant Zoster (Shingles) Vaccine: What You Need to Know 1. Why get vaccinated? Recombinant zoster (shingles) vaccine can prevent shingles. Shingles (also called herpes zoster, or just zoster) is a painful skin rash, usually with blisters. In addition to the rash, shingles can cause fever, headache, chills, or upset stomach. Rarely, shingles can lead to complications such as pneumonia, hearing problems, blindness, brain inflammation (encephalitis), or death. The risk of shingles increases with age. The most common complication of shingles is long-term nerve pain called postherpetic neuralgia (PHN). PHN occurs in the areas where the shingles rash was and can last for months or years after the rash goes away. The pain from PHN can be severe and debilitating. The risk of PHN increases with age. An older adult with shingles is more likely to develop PHN and have longer lasting and more severe pain than a younger person. People with weakened immune systems also have a higher risk of getting shingles and complications from the disease. Shingles is caused by varicella-zoster virus, the same virus that causes chickenpox. After you have chickenpox, the virus stays in your body and can cause shingles later in life. Shingles cannot be passed from one person to another, but the virus that causes shingles can spread and cause chickenpox in someone who has never had chickenpox or has never received chickenpox vaccine. 2. Recombinant shingles vaccine Recombinant shingles vaccine provides strong protection against shingles. By preventing shingles, recombinant shingles vaccine also protects against PHN and other complications. Recombinant shingles vaccine is recommended for: Adults 50 years and older Adults 19 years and older who have a weakened immune system because of disease or treatments Shingles vaccine is given as a two-dose series. For most people, the second dose should be given 2 to 6 months after the first  dose. Some people who have or will have a weakened immune system can get the second dose 1 to 2 months after the first dose. Ask your health care provider for guidance. People who have had shingles in the past and people who have received varicella (chickenpox) vaccine are recommended to get recombinant shingles vaccine. The vaccine is also recommended for people who have already gotten another type of shingles vaccine, the live shingles vaccine. There is no live virus in recombinant shingles vaccine. Shingles vaccine may be given at the same time as other vaccines. 3. Talk with your health care provider Tell your vaccination provider if the person getting the vaccine: Has had an allergic reaction after a previous dose of recombinant shingles vaccine, or has any severe, life-threatening allergies Is currently experiencing an episode of shingles Is pregnant In some cases, your health care provider may decide to postpone shingles vaccination until a future visit. People with minor illnesses, such as a cold, may be vaccinated. People who are moderately or severely ill should usually wait until they recover before getting recombinant shingles vaccine. Your health care provider can give you more information. 4. Risks of a vaccine reaction A sore arm with mild or moderate pain is very common after recombinant shingles vaccine. Redness and swelling can also happen at the site of the injection. Tiredness, muscle pain, headache, shivering, fever, stomach pain, and nausea are common after recombinant shingles vaccine. These side effects may temporarily prevent a vaccinated person from doing regular activities. Symptoms usually go away on their own in 2 to 3 days. You should still get the second dose of recombinant shingles vaccine even if you had one of these reactions after the first dose. Guillain-Barr   syndrome (GBS), a serious nervous system disorder, has been reported very rarely after recombinant zoster  vaccine. People sometimes faint after medical procedures, including vaccination. Tell your provider if you feel dizzy or have vision changes or ringing in the ears. As with any medicine, there is a very remote chance of a vaccine causing a severe allergic reaction, other serious injury, or death. 5. What if there is a serious problem? An allergic reaction could occur after the vaccinated person leaves the clinic. If you see signs of a severe allergic reaction (hives, swelling of the face and throat, difficulty breathing, a fast heartbeat, dizziness, or weakness), call 9-1-1 and get the person to the nearest hospital. For other signs that concern you, call your health care provider. Adverse reactions should be reported to the Vaccine Adverse Event Reporting System (VAERS). Your health care provider will usually file this report, or you can do it yourself. Visit the VAERS website at www.vaers.hhs.gov or call 1-800-822-7967. VAERS is only for reporting reactions, and VAERS staff members do not give medical advice. 6. How can I learn more? Ask your health care provider. Call your local or state health department. Visit the website of the Food and Drug Administration (FDA) for vaccine package inserts and additional information at www.fda.gov/vaccinesblood-biologics/vaccines. Contact the Centers for Disease Control and Prevention (CDC): Call 1-800-232-4636 (1-800-CDC-INFO) or Visit CDC's website at www.cdc.gov/vaccines. Source: CDC Vaccine Information Statement Recombinant Zoster Vaccine (01/23/2021) This same material is available at www.cdc.gov for no charge. This information is not intended to replace advice given to you by your health care provider. Make sure you discuss any questions you have with your health care provider. Document Revised: 11/04/2021 Document Reviewed: 02/06/2021 Elsevier Patient Education  2023 Elsevier Inc.  

## 2022-06-17 NOTE — Progress Notes (Signed)
Taylor Prince is a 56 y.o. female presents to office today for annual physical exam examination.    Concerns today include: 1. Physical   Occupation: NA, Marital status: M, Substance use: none Diet: regular , Exercise: walking   Health Maintenance  Topic Date Due   COVID-19 Vaccine (1) Never done   HIV Screening  Never done   Hepatitis C Screening  Never done   COLONOSCOPY (Pts 45-35yr Insurance coverage will need to be confirmed)  Never done   Zoster Vaccines- Shingrix (1 of 2) 09/17/2022 (Originally 04/24/2016)   INFLUENZA VACCINE  07/20/2022   MAMMOGRAM  08/15/2023   PAP SMEAR-Modifier  03/13/2024   TETANUS/TDAP  02/24/2031   HPV VACCINES  Aged Out     No past medical history on file. Social History   Socioeconomic History   Marital status: Single    Spouse name: Not on file   Number of children: Not on file   Years of education: Not on file   Highest education level: Not on file  Occupational History   Occupation: private duty   Tobacco Use   Smoking status: Never   Smokeless tobacco: Never  Vaping Use   Vaping Use: Never used  Substance and Sexual Activity   Alcohol use: Not Currently   Drug use: Not Currently    Types: Marijuana    Comment: Denies use with tobacco   Sexual activity: Not on file  Other Topics Concern   Not on file  Social History Narrative   Not on file   Social Determinants of Health   Financial Resource Strain: Not on file  Food Insecurity: Not on file  Transportation Needs: Not on file  Physical Activity: Not on file  Stress: Not on file  Social Connections: Not on file  Intimate Partner Violence: Not on file   Past Surgical History:  Procedure Laterality Date   CESAREAN SECTION     KIDNEY STONE SURGERY     Family History  Problem Relation Age of Onset   Colon cancer Mother    Colon polyps Mother    Kidney disease Mother    Esophageal cancer Neg Hx    Rectal cancer Neg Hx    Stomach  cancer Neg Hx    No current outpatient medications on file. No outpatient encounter medications on file as of 06/17/2022.   No facility-administered encounter medications on file as of 06/17/2022.    No Known Allergies   ROS: Review of Systems Pertinent items noted in HPI and remainder of comprehensive ROS otherwise negative.    Physical exam BP 113/79   Pulse 93   Temp 98 F (36.7 C) (Oral)   Ht '4\' 8"'  (1.422 m)   Wt 116 lb 9.6 oz (52.9 kg)   SpO2 94%   BMI 26.14 kg/m  General appearance: alert and no distress Head: Normocephalic, without obvious abnormality, atraumatic Eyes: conjunctivae/corneas clear. PERRL, EOM's intact. Fundi benign. Ears: normal TM's and external ear canals both ears Nose: Nares normal. Septum midline. Mucosa normal. No drainage or sinus tenderness. Neck: no adenopathy, no carotid bruit, no JVD, supple, symmetrical, trachea midline, and thyroid not enlarged, symmetric, no tenderness/mass/nodules Lungs: clear to auscultation bilaterally Heart: regular rate and rhythm, S1, S2 normal, no murmur, click, rub or gallop Abdomen: soft, non-tender; bowel sounds normal; no masses,  no organomegaly Extremities: extremities normal, atraumatic, no cyanosis or edema Pulses: 2+ and symmetric Skin: Skin color, texture, turgor normal. No rashes or lesions Lymph nodes:  Cervical, supraclavicular, and axillary nodes normal. Neurologic: Alert and oriented X 3, normal strength and tone. Normal symmetric reflexes. Normal coordination and gait    Assessment/ Plan: Taylor Prince here for annual physical exam.  Diagnoses and all orders for this visit:  Annual physical exam -     CBC with Differential -     CMP14+EGFR -     Lipid Panel -     Hemoglobin A1c   No problem-specific Assessment & Plan notes found for this encounter.   Counseled on healthy lifestyle choices, including diet (rich in fruits, vegetables and lean meats and low in salt and simple carbohydrates)  and exercise (at least 30 minutes of moderate physical activity daily).  Patient to follow up in 1 year for annual exam or sooner if needed.  The above assessment and management plan was discussed with the patient. The patient verbalized understanding of and has agreed to the management plan. Patient is aware to call the clinic if symptoms persist or worsen. Patient is aware when to return to the clinic for a follow-up visit. Patient educated on when it is appropriate to go to the emergency department.   This note has been created with Surveyor, quantity. Any transcriptional errors are unintentional.   Kerin Perna, NP 06/17/2022, 3:11 PM

## 2022-06-18 LAB — CBC WITH DIFFERENTIAL/PLATELET
Basophils Absolute: 0 10*3/uL (ref 0.0–0.2)
Basos: 0 %
EOS (ABSOLUTE): 0.2 10*3/uL (ref 0.0–0.4)
Eos: 2 %
Hematocrit: 40 % (ref 34.0–46.6)
Hemoglobin: 13.6 g/dL (ref 11.1–15.9)
Immature Grans (Abs): 0 10*3/uL (ref 0.0–0.1)
Immature Granulocytes: 0 %
Lymphocytes Absolute: 3.9 10*3/uL — ABNORMAL HIGH (ref 0.7–3.1)
Lymphs: 42 %
MCH: 30.9 pg (ref 26.6–33.0)
MCHC: 34 g/dL (ref 31.5–35.7)
MCV: 91 fL (ref 79–97)
Monocytes Absolute: 0.9 10*3/uL (ref 0.1–0.9)
Monocytes: 10 %
Neutrophils Absolute: 4.2 10*3/uL (ref 1.4–7.0)
Neutrophils: 46 %
Platelets: 212 10*3/uL (ref 150–450)
RBC: 4.4 x10E6/uL (ref 3.77–5.28)
RDW: 13.9 % (ref 11.7–15.4)
WBC: 9.2 10*3/uL (ref 3.4–10.8)

## 2022-06-18 LAB — LIPID PANEL
Chol/HDL Ratio: 4.7 ratio — ABNORMAL HIGH (ref 0.0–4.4)
Cholesterol, Total: 217 mg/dL — ABNORMAL HIGH (ref 100–199)
HDL: 46 mg/dL (ref >39)
LDL Chol Calc (NIH): 144 mg/dL — ABNORMAL HIGH (ref 0–99)
Triglycerides: 152 mg/dL — ABNORMAL HIGH (ref 0–149)
VLDL Cholesterol Cal: 27 mg/dL (ref 5–40)

## 2022-06-18 LAB — CMP14+EGFR
ALT: 13 IU/L (ref 0–32)
AST: 17 IU/L (ref 0–40)
Albumin/Globulin Ratio: 1.7 (ref 1.2–2.2)
Albumin: 4.3 g/dL (ref 3.8–4.9)
Alkaline Phosphatase: 114 IU/L (ref 44–121)
BUN/Creatinine Ratio: 21 (ref 9–23)
BUN: 20 mg/dL (ref 6–24)
Bilirubin Total: 0.4 mg/dL (ref 0.0–1.2)
CO2: 22 mmol/L (ref 20–29)
Calcium: 9.4 mg/dL (ref 8.7–10.2)
Chloride: 108 mmol/L — ABNORMAL HIGH (ref 96–106)
Creatinine, Ser: 0.96 mg/dL (ref 0.57–1.00)
Globulin, Total: 2.6 g/dL (ref 1.5–4.5)
Glucose: 101 mg/dL — ABNORMAL HIGH (ref 70–99)
Potassium: 4.1 mmol/L (ref 3.5–5.2)
Sodium: 143 mmol/L (ref 134–144)
Total Protein: 6.9 g/dL (ref 6.0–8.5)
eGFR: 69 mL/min/{1.73_m2} (ref >59)

## 2022-06-18 LAB — HEMOGLOBIN A1C
Est. average glucose Bld gHb Est-mCnc: 97 mg/dL
Hgb A1c MFr Bld: 5 % (ref 4.8–5.6)

## 2022-06-24 ENCOUNTER — Telehealth (INDEPENDENT_AMBULATORY_CARE_PROVIDER_SITE_OTHER): Payer: Self-pay

## 2022-06-24 NOTE — Telephone Encounter (Signed)
Patient is aware of all results and to work on a low fat/cholesterol diet. She verbalized understanding. Maryjean Morn, CMA

## 2022-06-24 NOTE — Telephone Encounter (Signed)
-----   Message from Claiborne Rigg, NP sent at 06/21/2022 11:32 PM EDT ----- A1c does not indicate any diabetes or prediabetes  CBC does not indicate any anemia or bleeding disorders   Kidney, liver function and electrolytes are normal.    Elevated cholesterol levels. Work on diet that is low in fat and low in cholesterol.
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
## 2022-09-14 ENCOUNTER — Ambulatory Visit: Payer: Self-pay

## 2022-10-06 ENCOUNTER — Ambulatory Visit: Payer: Self-pay

## 2022-11-18 ENCOUNTER — Ambulatory Visit: Payer: Self-pay

## 2023-01-13 ENCOUNTER — Ambulatory Visit: Payer: Self-pay

## 2023-05-17 ENCOUNTER — Telehealth: Payer: Self-pay | Admitting: Primary Care

## 2023-05-17 NOTE — Telephone Encounter (Signed)
Please contact pt and schedule  
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
## 2023-05-18 ENCOUNTER — Telehealth (INDEPENDENT_AMBULATORY_CARE_PROVIDER_SITE_OTHER): Payer: Self-pay | Admitting: Primary Care

## 2023-05-18 IMAGING — MG MM BREAST LOCALIZATION CLIP
4 series · 4 of 12 positions shown · non-contrast
Comparison: Previous exam(s).

CLINICAL DATA: Status post ultrasound-guided biopsy left breast
mass 3:30 o'clock.

EXAM:
3D DIAGNOSTIC LEFT MAMMOGRAM POST ULTRASOUND BIOPSY

[L ML synth-2D]
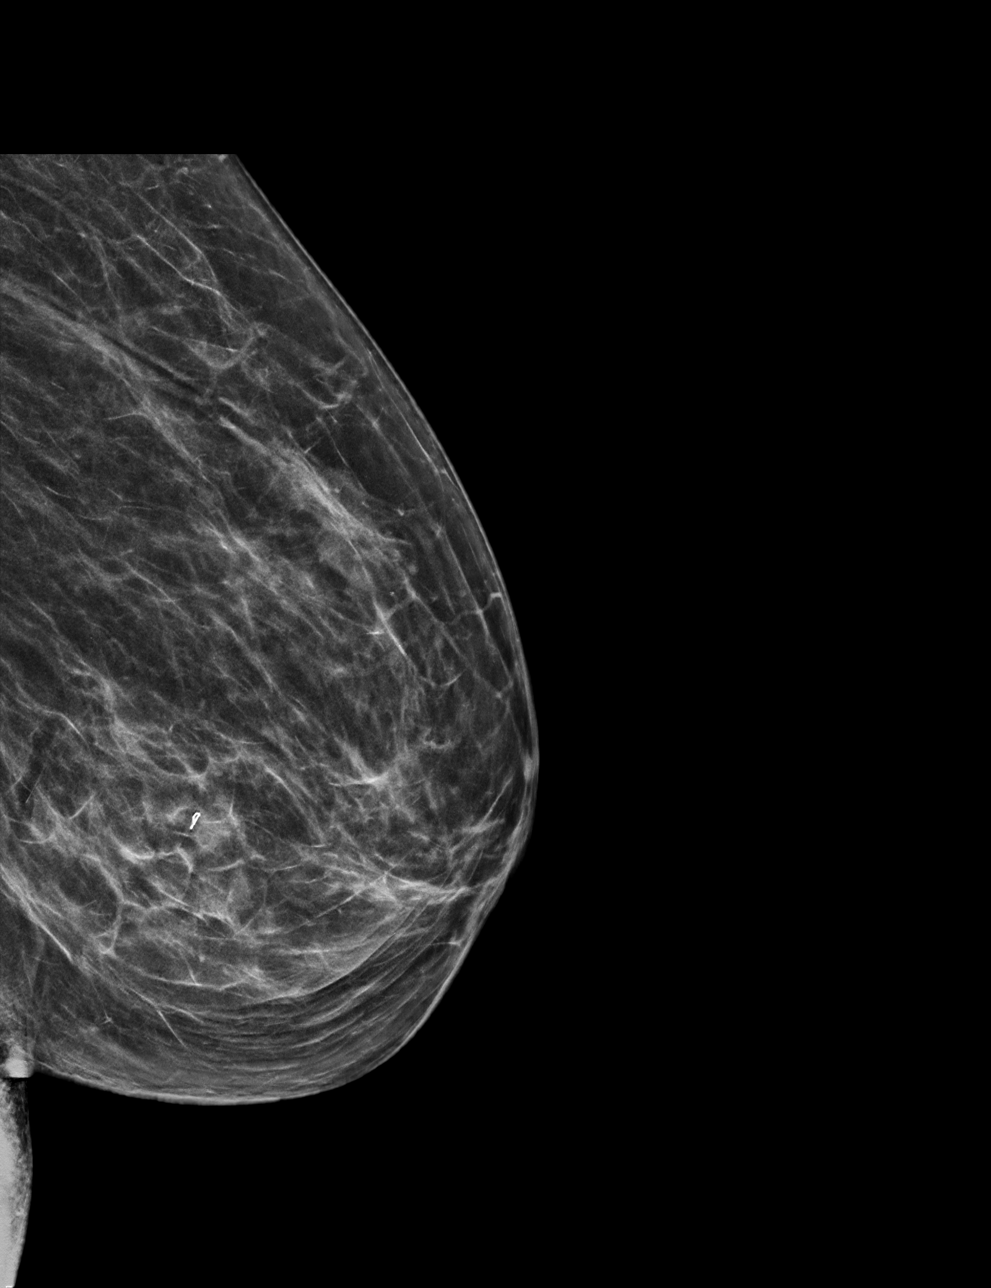

[L CC synth-2D]
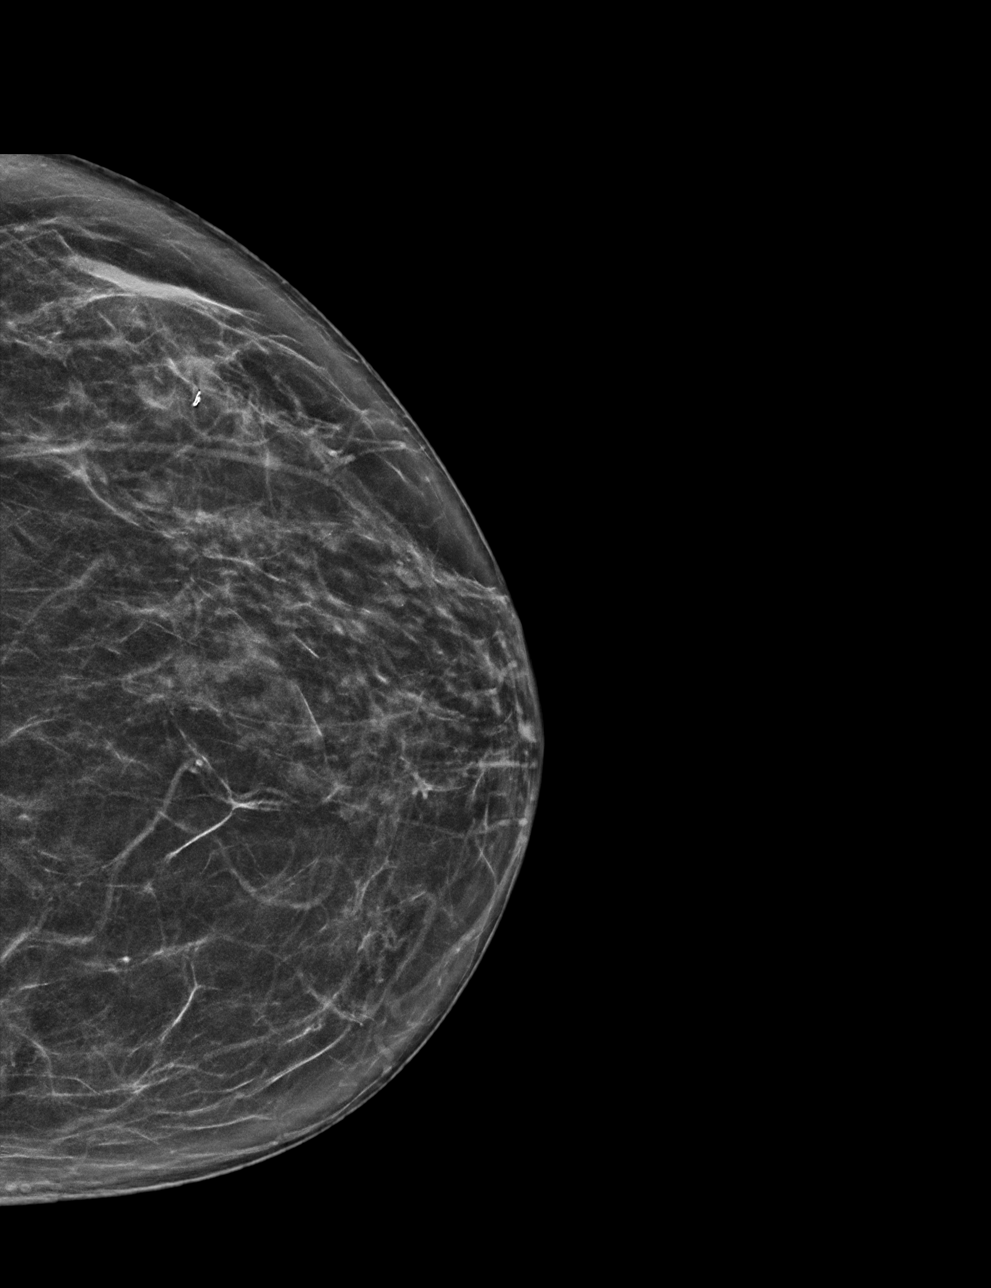

[L CC tomo · tomo slice 29/58.0]
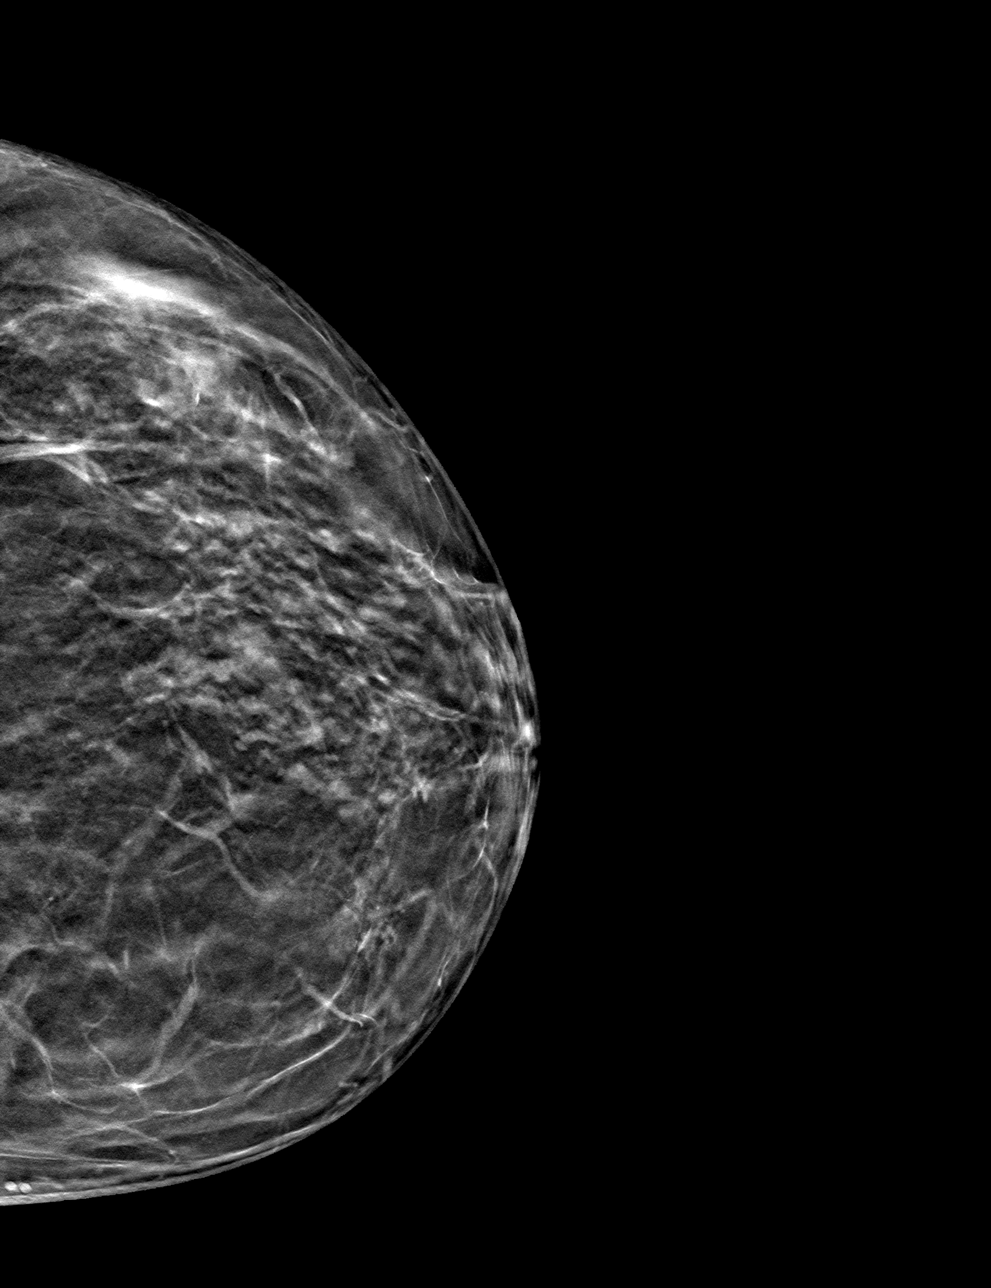

[L ML tomo · tomo slice 35/68.0]
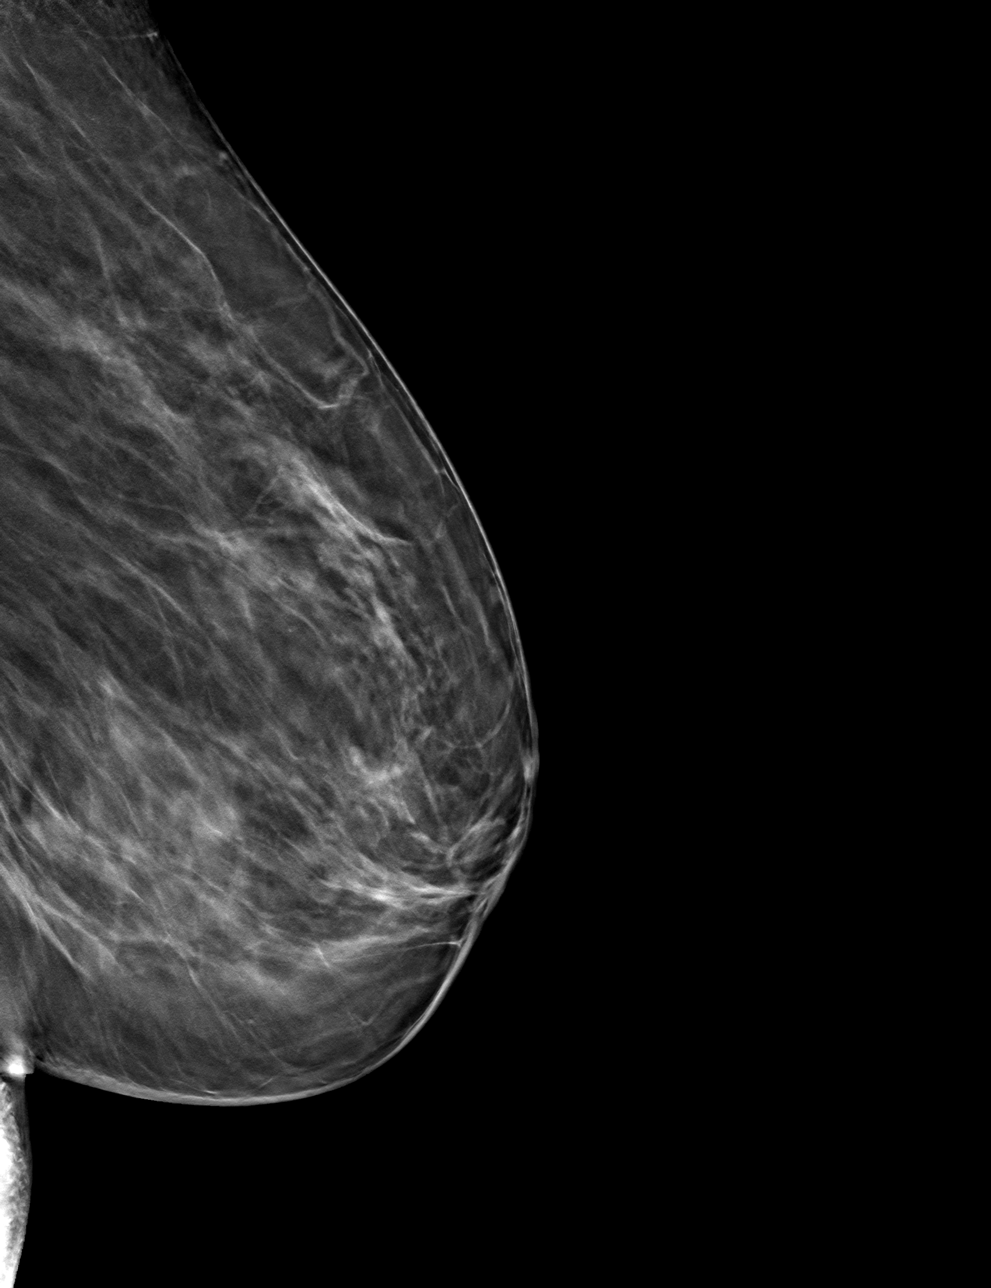

[4 of 12 positions shown; findings below may reference images not displayed]

FINDINGS: 3D Mammographic images were obtained following ultrasound guided
biopsy of left breast mass 3:30 o'clock. The biopsy marking clip is
in expected position at the site of biopsy.
IMPRESSION: Appropriate positioning of the ribbon shaped biopsy marking clip at
the site of biopsy in the left breast mass 3:30 o'clock.

Final Assessment: Post Procedure Mammograms for Marker Placement

## 2023-05-18 NOTE — Telephone Encounter (Signed)
Spoke with pt and was able to book apt

## 2023-05-24 ENCOUNTER — Encounter (INDEPENDENT_AMBULATORY_CARE_PROVIDER_SITE_OTHER): Payer: Self-pay | Admitting: Primary Care

## 2023-05-24 ENCOUNTER — Ambulatory Visit (INDEPENDENT_AMBULATORY_CARE_PROVIDER_SITE_OTHER): Payer: No Typology Code available for payment source | Admitting: Primary Care

## 2023-05-24 DIAGNOSIS — M545 Low back pain, unspecified: Secondary | ICD-10-CM
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
## 2023-05-25 ENCOUNTER — Ambulatory Visit (INDEPENDENT_AMBULATORY_CARE_PROVIDER_SITE_OTHER): Payer: 59 | Admitting: Physician Assistant

## 2023-05-25 ENCOUNTER — Other Ambulatory Visit (INDEPENDENT_AMBULATORY_CARE_PROVIDER_SITE_OTHER): Payer: 59

## 2023-05-25 ENCOUNTER — Encounter: Payer: Self-pay | Admitting: Physician Assistant

## 2023-05-25 ENCOUNTER — Ambulatory Visit (INDEPENDENT_AMBULATORY_CARE_PROVIDER_SITE_OTHER): Payer: Self-pay | Admitting: Primary Care

## 2023-05-25 DIAGNOSIS — M5441 Lumbago with sciatica, right side: Secondary | ICD-10-CM

## 2023-05-25 MED ORDER — TIZANIDINE HCL 4 MG PO TABS: 4.0000 mg | ORAL_TABLET | Freq: Four times a day (QID) | ORAL | 0 refills | Status: DC | PRN
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
## 2023-06-10 ENCOUNTER — Telehealth (INDEPENDENT_AMBULATORY_CARE_PROVIDER_SITE_OTHER): Payer: Self-pay

## 2023-06-10 NOTE — Telephone Encounter (Signed)
Returned pt call pt didn't answer. Lvm making pt aware that paperwork takes 7-14 business days and we will call once paperwork is completed

## 2023-06-10 NOTE — Telephone Encounter (Signed)
Copied from CRM [id number]. Topic: General - Other >> Jun 10, 2023  9:38 AM Franchot Heidelberg wrote: Reason for CRM: Pt called for an update on paperwork that was dropped off earlier this week, patient wants a call back with a status update today.   Best contact: [phone]

## 2023-06-17 ENCOUNTER — Telehealth: Payer: Self-pay | Admitting: Physician Assistant

## 2023-06-17 ENCOUNTER — Telehealth (INDEPENDENT_AMBULATORY_CARE_PROVIDER_SITE_OTHER): Payer: Self-pay | Admitting: Primary Care

## 2023-06-17 NOTE — Telephone Encounter (Signed)
FMLA forms received. To [employer]. 

## 2023-06-17 NOTE — Telephone Encounter (Signed)
Informed patient FMLA needed to be completed by ortho. Ortho has taken her out of work. Patient in agreement

## 2023-06-20 ENCOUNTER — Ambulatory Visit (INDEPENDENT_AMBULATORY_CARE_PROVIDER_SITE_OTHER): Payer: Self-pay | Admitting: Primary Care

## 2023-06-21 ENCOUNTER — Other Ambulatory Visit: Payer: 59

## 2023-06-21 ENCOUNTER — Encounter: Payer: Self-pay | Admitting: Orthopedic Surgery

## 2023-06-24 ENCOUNTER — Other Ambulatory Visit: Payer: No Typology Code available for payment source

## 2023-06-24 ENCOUNTER — Telehealth (INDEPENDENT_AMBULATORY_CARE_PROVIDER_SITE_OTHER): Payer: Self-pay

## 2023-06-24 ENCOUNTER — Other Ambulatory Visit: Payer: 59

## 2023-06-24 NOTE — Telephone Encounter (Signed)
Returned pt call. Made pt aware that she will need to get work release note from ortho. Pt states she understands and doesn't have any questions or concerns

## 2023-06-24 NOTE — Telephone Encounter (Signed)
Copied from CRM [id number]. Topic: General - Other >> Jun 24, 2023  1:14 PM Dominique A wrote: Reason for CRM: Pt states that her insurance has stopped and she is needing her PCP to let her go back to work so that she can get her MRI done. Pt states she spoke with her insurance company and once she go back to work, her insurance will be re instated and they will be able to pay for there MRI. Pt would like a call back from her PCP to discuss.

## 2023-07-11 ENCOUNTER — Encounter: Payer: Self-pay | Admitting: Physician Assistant

## 2023-07-11 ENCOUNTER — Ambulatory Visit (INDEPENDENT_AMBULATORY_CARE_PROVIDER_SITE_OTHER): Payer: No Typology Code available for payment source | Admitting: Physician Assistant

## 2023-07-11 ENCOUNTER — Telehealth: Payer: Self-pay | Admitting: Physician Assistant

## 2023-07-11 DIAGNOSIS — M5410 Radiculopathy, site unspecified: Secondary | ICD-10-CM

## 2023-07-11 NOTE — Telephone Encounter (Signed)
Pt came in to fill out Auth form and pay $25 in cash for [employer]
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
## 2023-07-15 ENCOUNTER — Telehealth: Payer: Self-pay | Admitting: Physician Assistant

## 2023-07-15 NOTE — Telephone Encounter (Signed)
[employer] forms received. To [employer]. 

## 2023-08-02 ENCOUNTER — Telehealth: Payer: Self-pay | Admitting: Physician Assistant

## 2023-08-02 NOTE — Telephone Encounter (Signed)
Please advise on return

## 2023-08-02 NOTE — Telephone Encounter (Signed)
Lvm advising pt Taylor Prince will return to the office on Thursday to advise

## 2023-08-02 NOTE — Telephone Encounter (Signed)
Patient called. Says her job needs a note to return to work with a date and no restrictions. Her cb# [phone]. Would like to pick it up.

## 2023-08-04 ENCOUNTER — Encounter (HOSPITAL_BASED_OUTPATIENT_CLINIC_OR_DEPARTMENT_OTHER): Payer: Self-pay

## 2023-08-04 NOTE — Telephone Encounter (Signed)
Lvm for pt to cb to find out when they want to return to work

## 2023-09-28 ENCOUNTER — Ambulatory Visit (HOSPITAL_COMMUNITY)
Admission: EM | Admit: 2023-09-28 | Discharge: 2023-09-28 | Disposition: A | Discharge status: Home / Self Care | Payer: No Typology Code available for payment source | Attending: Internal Medicine | Admitting: Internal Medicine

## 2023-09-28 ENCOUNTER — Encounter (HOSPITAL_COMMUNITY): Payer: Self-pay

## 2023-09-28 DIAGNOSIS — Z202 Contact with and (suspected) exposure to infections with a predominantly sexual mode of transmission: Secondary | ICD-10-CM | POA: Diagnosis not present

## 2023-09-28 NOTE — Discharge Instructions (Addendum)
Best results will be released to your MyChart account.  If anything is positive we will contact you discussed treatment.  If you develop a rash return or see your primary care doctor for herpes testing at that time.

## 2023-09-28 NOTE — ED Triage Notes (Signed)
Pt requesting STD testing and test for herpes. States her partner said he had a herpes outbreak 2wks after they had sex. Denies any sx's.

## 2023-09-28 NOTE — ED Provider Notes (Signed)
[facility]    CSN: [id number] Arrival date & time: 09/28/23  0941      History   Chief Complaint Chief Complaint  Patient presents with   SEXUALLY TRANSMITTED DISEASE    HPI Taylor Prince is a 57 y.o. female.   HPI Sexual partner told her he had herpes 2 weeks after their encounter.  She denies vaginal discharge, dysuria, frequency, urgency, genital rash.  She is requesting HSV testing.  History reviewed. No pertinent past medical history.  There are no problems to display for this patient.   Past Surgical History:  Procedure Laterality Date   CESAREAN SECTION     KIDNEY STONE SURGERY      OB History   No obstetric history on file.      Home Medications    Prior to Admission medications   Medication Sig Start Date End Date Taking? Authorizing Provider  tiZANidine (ZANAFLEX) 4 MG tablet Take 1 tablet (4 mg total) by mouth every 6 (six) hours as needed for muscle spasms. 05/25/23   Persons, [place], [place]    Family History Family History  Problem Relation Age of Onset   Colon cancer Mother    Colon polyps Mother    Kidney disease Mother    Esophageal cancer Neg Hx    Rectal cancer Neg Hx    Stomach cancer Neg Hx     Social History Social History   Tobacco Use   Smoking status: Never   Smokeless tobacco: Never  Vaping Use   Vaping status: Never Used  Substance Use Topics   Alcohol use: Not Currently   Drug use: Not Currently    Types: Marijuana    Comment: Denies use with tobacco     Allergies   Patient has no known allergies.   Review of Systems Review of Systems  Constitutional:  Negative for fatigue and fever.  Genitourinary:  Negative for dysuria, frequency, genital sores, pelvic pain and urgency.  Skin:  Negative for rash.     Physical Exam Triage Vital Signs ED Triage Vitals  Encounter Vitals Group     BP 09/28/23 1018 (!) 178/101     Systolic BP Percentile --      Diastolic BP Percentile --      Pulse Rate  09/28/23 1018 86     Resp 09/28/23 1018 18     Temp 09/28/23 1018 97.8 F (36.6 C)     Temp Source 09/28/23 1018 Oral     SpO2 09/28/23 1018 97 %     Weight --      Height --      Head Circumference --      Peak Flow --      Pain Score 09/28/23 1019 0     Pain Loc --      Pain Education --      Exclude from Growth Chart --    No data found.  Updated Vital Signs BP (!) 158/90 (BP Location: Left Arm)   Pulse 86   Temp 97.8 F (36.6 C) (Oral)   Resp 18   SpO2 97%   Visual Acuity Right Eye Distance:   Left Eye Distance:   Bilateral Distance:    Right Eye Near:   Left Eye Near:    Bilateral Near:     Physical Exam Vitals and nursing note reviewed.  HENT:     Head: Normocephalic and atraumatic.  Eyes:     Conjunctiva/sclera: Conjunctivae normal.  Cardiovascular:  Rate and Rhythm: Normal rate.  Pulmonary:     Effort: Pulmonary effort is normal. No respiratory distress.  Skin:    General: Skin is warm and dry.  Neurological:     Mental Status: She is alert and oriented to person, place, and time.      UC Treatments / Results  Labs (all labs ordered are listed, but only abnormal results are displayed) Labs Reviewed  CERVICOVAGINAL ANCILLARY ONLY    EKG   Radiology No results found.  Procedures Procedures (including critical care time)  Medications Ordered in UC Medications - No data to display  Initial Impression / Assessment and Plan / UC Course  I have reviewed the triage vital signs and the nursing notes.  Pertinent labs & imaging results that were available during my care of the patient were reviewed by me and considered in my medical decision making (see chart for details).     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  Denies symptoms.  Clines HIV and syphilis testing.  Agrees to cytology panel. Patient counseled to see PCP or return if she develops rash Final Clinical Impressions(s) / UC Diagnoses   Final diagnoses:   Possible exposure to STI     Discharge Instructions      Best results will be released to your MyChart account.  If anything is positive we will contact you discussed treatment.  If you develop a rash return or see your primary care doctor for herpes testing at that time.     ED Prescriptions   None    PDMP not reviewed this encounter.   Meliton Rattan, [place] 09/28/23 1040

## 2023-09-29 LAB — CERVICOVAGINAL ANCILLARY ONLY
Bacterial Vaginitis (gardnerella): NEGATIVE
Candida Glabrata: NEGATIVE
Candida Vaginitis: NEGATIVE
Chlamydia: NEGATIVE
Comment: NEGATIVE
Comment: NEGATIVE
Comment: NEGATIVE
Comment: NEGATIVE
Comment: NEGATIVE
Comment: NORMAL
Neisseria Gonorrhea: NEGATIVE
Trichomonas: NEGATIVE

## 2023-12-26 ENCOUNTER — Ambulatory Visit (INDEPENDENT_AMBULATORY_CARE_PROVIDER_SITE_OTHER): Payer: No Typology Code available for payment source | Admitting: Primary Care

## 2023-12-26 ENCOUNTER — Encounter (INDEPENDENT_AMBULATORY_CARE_PROVIDER_SITE_OTHER): Payer: Self-pay | Admitting: Primary Care

## 2023-12-26 VITALS — BP 130/80 | Resp 16 | Ht <= 58 in | Wt 122.8 lb

## 2023-12-26 DIAGNOSIS — Z28310 Unvaccinated for covid-19: Secondary | ICD-10-CM

## 2023-12-26 DIAGNOSIS — Z1231 Encounter for screening mammogram for malignant neoplasm of breast: Secondary | ICD-10-CM | POA: Diagnosis not present

## 2023-12-26 DIAGNOSIS — Z2821 Immunization not carried out because of patient refusal: Secondary | ICD-10-CM

## 2023-12-26 DIAGNOSIS — R109 Unspecified abdominal pain: Secondary | ICD-10-CM

## 2023-12-26 DIAGNOSIS — H9313 Tinnitus, bilateral: Secondary | ICD-10-CM | POA: Diagnosis not present

## 2023-12-26 DIAGNOSIS — Z1211 Encounter for screening for malignant neoplasm of colon: Secondary | ICD-10-CM

## 2023-12-26 NOTE — Progress Notes (Signed)
 [facility]  Taylor Prince, is a 58 y.o. female  RDW:[id number]  FMW:[id number]  DOB - 19-Jul-1966  Chief Complaint  Patient presents with   Spasms       Subjective:   Taylor Prince is a 58 y.o. female here today for an acte visit. Patient c/o abdominal spasm, Tinnitus bilateral ears. She has No headache, No chest pain, No abdominal pain - No Nausea, No new weakness tingling or numbness, No Cough - shortness of breath   No problems updated.  Comprehensive ROS Pertinent positive and negative noted in HPI   No Known Allergies  No past medical history on file.  No current outpatient medications on file prior to visit.   No current facility-administered medications on file prior to visit.   Health Maintenance  Topic Date Due   HIV Screening  Never done   Hepatitis C Screening  Never done   Colon Cancer Screening  Never done   Mammogram  08/15/2023   COVID-19 Vaccine (1 - 2024-25 season) 01/11/2024*   Flu Shot  03/19/2024*   Zoster (Shingles) Vaccine (1 of 2) 03/25/2024*   Pap with HPV screening  03/13/2024   DTaP/Tdap/Td vaccine (2 - Td or Tdap) 02/24/2031   HPV Vaccine  Aged Out  *Topic was postponed. The date shown is not the original due date.    Objective:   Vitals:   12/26/23 1535 12/26/23 1537  BP: (!) 159/129 130/80  Resp: 16   Weight: 122 lb 12.8 oz (55.7 kg)   Height: 4' 7 (1.397 m)    BP Readings from Last 3 Encounters:  12/26/23 130/80  09/28/23 (!) 158/90  05/24/23 (!) 137/92      Physical Exam Vitals reviewed.  HENT:     Head: Normocephalic.     Right Ear: Tympanic membrane and external ear normal.     Left Ear: Tympanic membrane and external ear normal.     Nose: Nose normal.  Eyes:     Extraocular Movements: Extraocular movements intact.     Pupils: Pupils are equal, round, and reactive to light.  Cardiovascular:     Rate and Rhythm: Normal rate and regular rhythm.  Pulmonary:     Effort: Pulmonary effort is normal.      Breath sounds: Normal breath sounds.  Abdominal:     Palpations: Abdomen is soft.     Tenderness: There is abdominal tenderness.  Musculoskeletal:        General: Normal range of motion.     Cervical back: Normal range of motion and neck supple.  Skin:    General: Skin is warm and dry.  Neurological:     Mental Status: She is oriented to person, place, and time.  Psychiatric:        Mood and Affect: Mood normal.       Assessment & Plan  [name] was seen today for spasms.  Diagnoses and all orders for this visit:  Herpes zoster vaccination declined  Influenza vaccination declined  COVID-19 vaccine series declined  Encounter for screening mammogram for malignant neoplasm of breast -     MM 3D SCREENING MAMMOGRAM BILATERAL BREAST; Future  Colon cancer screening -     Ambulatory referral to Gastroenterology  Tinnitus of both ears -     Ambulatory referral to ENT  Abdominal spasms -     Ambulatory referral to Gastroenterology      Patient have been counseled extensively about nutrition and exercise. Other issues discussed during this visit include:  low cholesterol diet, weight control and daily exercise, foot care, annual eye examinations at Ophthalmology, importance of adherence with medications and regular follow-up. We also discussed long term complications of uncontrolled diabetes and hypertension.   No follow-ups on file.  The patient was given clear instructions to go to ER or return to medical center if symptoms don't improve, worsen or new problems develop. The patient verbalized understanding. The patient was told to call to get lab results if they haven't heard anything in the next week.   This note has been created with Education officer, environmental. Any transcriptional errors are unintentional.   Taylor SHAUNNA Bohr, NP 12/26/2023, 4:13 PM

## 2023-12-29 ENCOUNTER — Ambulatory Visit
Admission: RE | Admit: 2023-12-29 | Discharge: 2023-12-29 | Disposition: A | Discharge status: Home / Self Care | Payer: 59 | Source: Ambulatory Visit | Attending: Primary Care | Admitting: Primary Care

## 2023-12-29 DIAGNOSIS — Z1231 Encounter for screening mammogram for malignant neoplasm of breast: Secondary | ICD-10-CM

## 2024-01-17 ENCOUNTER — Encounter (INDEPENDENT_AMBULATORY_CARE_PROVIDER_SITE_OTHER): Payer: Self-pay | Admitting: Primary Care

## 2024-01-17 ENCOUNTER — Ambulatory Visit (INDEPENDENT_AMBULATORY_CARE_PROVIDER_SITE_OTHER): Payer: 59 | Admitting: Primary Care

## 2024-01-17 VITALS — BP 127/78 | HR 78 | Resp 16 | Ht <= 58 in | Wt 121.2 lb

## 2024-01-17 DIAGNOSIS — Z09 Encounter for follow-up examination after completed treatment for conditions other than malignant neoplasm: Secondary | ICD-10-CM

## 2024-01-17 DIAGNOSIS — K2970 Gastritis, unspecified, without bleeding: Secondary | ICD-10-CM | POA: Diagnosis not present

## 2024-01-17 MED ORDER — OMEPRAZOLE 20 MG PO CPDR: 20.0000 mg | DELAYED_RELEASE_CAPSULE | Freq: Every day | ORAL | 3 refills | Status: AC

## 2024-01-17 MED ORDER — OMEPRAZOLE 20 MG PO CPDR: 20.0000 mg | DELAYED_RELEASE_CAPSULE | Freq: Every day | ORAL | 3 refills | Status: DC

## 2024-01-17 NOTE — Progress Notes (Signed)
   Subjective:   [name] is a 58 y.o. female presents for [facility] ED follow up on 01/12/24 she complaint of left sided pain onset months ago but worsening over the past month. Pain is described as a persistent, stabbing pain that radiates from the left mid back around to the left upper quadrant. Lying on her back makes the pain worse. dx with Left flank pain Chronic left-sided thoracic back pain; Acute gastritis, presence of bleeding unspecified, unspecified gastritis type;  and Abnormal CT of the abdomen . No Known Allergies  No current outpatient medications on file prior to visit.   No current facility-administered medications on file prior to visit.    Review of System: ROS Comprehensive ROS Pertinent positive and negative noted in HPI   Objective:  BP 127/78   Pulse 78   Resp 16   Ht 4\' 8"  (1.422 m)   Wt 121 lb 3.2 oz (55 kg)   SpO2 97%   BMI 27.17 kg/m   Physical Exam Vitals reviewed.  Constitutional:      Appearance: Normal appearance.  HENT:     Head: Normocephalic.     Right Ear: Tympanic membrane and external ear normal.     Left Ear: Tympanic membrane and external ear normal.     Nose: Nose normal.  Eyes:     Extraocular Movements: Extraocular movements intact.  Cardiovascular:     Rate and Rhythm: Normal rate.  Pulmonary:     Effort: Pulmonary effort is normal.     Breath sounds: Normal breath sounds.  Abdominal:     General: Bowel sounds are normal.     Palpations: Abdomen is soft.  Musculoskeletal:        General: Normal range of motion.     Cervical back: Normal range of motion.  Skin:    General: Skin is warm and dry.  Neurological:     Mental Status: She is alert and oriented to person, place, and time.  Psychiatric:        Mood and Affect: Mood normal.        Behavior: Behavior normal.      Assessment:  Taylor Prince was seen today for hospitalization follow-up.  Diagnoses and all orders for this visit:  Gastritis without  bleeding, unspecified chronicity, unspecified gastritis type Discussed eating small frequent meal, reduction in acidic foods, fried foods ,spicy foods, alcohol caffeine and tobacco and certain medications. Avoid laying down after eating 66mins-1hour, elevated head of the bed.  -     Discontinue: omeprazole (PRILOSEC) 20 MG capsule; Take 1 capsule (20 mg total) by mouth daily. -     omeprazole (PRILOSEC) 20 MG capsule; Take 1 capsule (20 mg total) by mouth daily.  Hospital discharge follow-up See HPI   This note has been created with Dragon speech recognition software and Paediatric nurse. Any transcriptional errors are unintentional.   Return in about 6 months (around 07/16/2024).  Grayce Sessions, NP 01/22/2024, 12:45 AM

## 2024-01-17 NOTE — Patient Instructions (Signed)
Gastritis, Adult Gastritis is irritation and swelling (inflammation) of the stomach. There are two kinds of gastritis: Acute gastritis. This kind develops quickly. Chronic gastritis. This kind is much more common. It develops slowly and lasts for a long time. It is important to get help for this condition. If you do not get help, your stomach can bleed, and you can get sores (ulcers) in your stomach. What are the causes? This condition may be caused by: Germs that get to your stomach and cause an infection. Drinking too much alcohol. Medicines you are taking. Having too much acid in the stomach. Having a disease of the stomach. Other causes may include: An allergic reaction. Some cancer treatments (radiation). Smoking cigarettes or using products that contain nicotine or tobacco. In some cases, the cause of this condition is not known. What increases the risk? Having a disease of the intestines. Having Crohn's disease. Using aspirin or ibuprofen and other NSAIDs to treat other conditions. Stress. What are the signs or symptoms? Pain in your stomach. A burning feeling in your stomach. Feeling like you may vomit (nauseous). Vomiting or vomiting blood. Feeling too full after you eat. Weight loss. Bad breath. Blood in your poop (stool). In some cases, there are no symptoms. How is this treated? This condition is treated with medicines. The medicines that are used depend on what caused the condition. You may be given: Antibiotic medicine, if your condition was caused by an infection from germs. H2 blockers and similar medicines, if your condition was caused by too much acid in the stomach. Treatment may also include stopping the use of certain medicines, such as aspirin or ibuprofen. Follow these instructions at home: Medicines Take over-the-counter and prescription medicines only as told by your doctor. If you were prescribed an antibiotic medicine, take it as told by your doctor.  Do not stop taking it even if you start to feel better. Alcohol use Do not drink alcohol if: Your doctor tells you not to drink. You are pregnant, may be pregnant, or are planning to become pregnant. If you drink alcohol: Limit your use to: 0-1 drink a day for women. 0-2 drinks a day for men. Know how much alcohol is in your drink. In the [place] one drink equals one 12 oz bottle of beer (355 mL), one 5 oz glass of wine (148 mL), or one 1 oz glass of hard liquor (44 mL). General instructions  Eat small meals often, instead of large meals. Avoid foods and drinks that make you feel worse. Drink enough fluid to keep your pee (urine) pale yellow. Talk with your doctor about ways to manage stress. You can exercise or do deep breathing, meditation, or yoga. Do not smoke or use any products that contain nicotine or tobacco. If you need help quitting, ask your doctor. Keep all follow-up visits. Contact a doctor if: Your symptoms get worse. Your stomach pain gets worse. Your symptoms go away and then come back. You have a fever. Get help right away if: You vomit blood or something that looks like coffee grounds. You have black or dark red poop. You throw up any time you try to drink fluids. These symptoms may be an emergency. Get help right away. Call your local emergency services (911 in the [place] Do not wait to see if the symptoms will go away. Do not drive yourself to the hospital. Summary Gastritis is irritation and swelling (inflammation) of the stomach. You must get help for this condition. If you do  not get help, your stomach can bleed, and you can get sores (ulcers) in your stomach. You can be treated with medicines for germs or medicines to block too much acid in your stomach. This information is not intended to replace advice given to you by your health care provider. Make sure you discuss any questions you have with your health care provider. Document Revised: 04/11/2021 Document  Reviewed: 04/11/2021 Elsevier Patient Education  2024 [employer].

## 2024-01-19 ENCOUNTER — Telehealth (INDEPENDENT_AMBULATORY_CARE_PROVIDER_SITE_OTHER): Payer: Self-pay | Admitting: Primary Care

## 2024-01-19 NOTE — Telephone Encounter (Signed)
Copied from CRM [id number]. Topic: Referral - Status >> Jan 19, 2024  3:07 PM Marlow Baars wrote: Reason for CRM: The patient called in inquiring about the status of her referral she says her provider was going to process to go to a liver specialist. She said she saw her provider 2 days ago and was told a referral would be processed rather quickly so she could see the liver specialist and she hasn't heard anything back yet. Please assist patient further as soon as possible.

## 2024-01-19 NOTE — Telephone Encounter (Signed)
Will forward to provider

## 2024-01-22 ENCOUNTER — Encounter (INDEPENDENT_AMBULATORY_CARE_PROVIDER_SITE_OTHER): Payer: Self-pay | Admitting: Primary Care

## 2024-01-23 ENCOUNTER — Telehealth: Payer: Self-pay

## 2024-01-23 NOTE — Telephone Encounter (Signed)
Copied from CRM [id number]. Topic: Referral - Status >> Jan 23, 2024 10:28 AM Hector Shade B wrote: Reason for CRM: [phone] called to check status of referral for liver specialist.

## 2024-01-23 NOTE — Telephone Encounter (Signed)
 Will forward to provider

## 2024-01-24 ENCOUNTER — Other Ambulatory Visit (INDEPENDENT_AMBULATORY_CARE_PROVIDER_SITE_OTHER): Payer: Self-pay | Admitting: Primary Care

## 2024-01-24 ENCOUNTER — Telehealth (INDEPENDENT_AMBULATORY_CARE_PROVIDER_SITE_OTHER): Payer: Self-pay | Admitting: Primary Care

## 2024-01-24 DIAGNOSIS — K2970 Gastritis, unspecified, without bleeding: Secondary | ICD-10-CM

## 2024-01-24 NOTE — Telephone Encounter (Signed)
Will forward to Nora  

## 2024-01-24 NOTE — Telephone Encounter (Signed)
Called pt to confirm atp. Pt will be present

## 2024-01-25 ENCOUNTER — Telehealth (INDEPENDENT_AMBULATORY_CARE_PROVIDER_SITE_OTHER): Payer: Self-pay | Admitting: Primary Care

## 2024-01-25 NOTE — Telephone Encounter (Signed)
Please contact pt and give referral information

## 2024-01-25 NOTE — Telephone Encounter (Signed)
 Called pt to give referral info. Pt did not answer and I could not leave a VM.  Referral Info  Sent Referral to [facility] [address]  [place] [address] PH# [phone] FAX [phone]

## 2024-01-31 ENCOUNTER — Ambulatory Visit (INDEPENDENT_AMBULATORY_CARE_PROVIDER_SITE_OTHER): Payer: 59 | Admitting: Primary Care

## 2024-02-29 ENCOUNTER — Telehealth (INDEPENDENT_AMBULATORY_CARE_PROVIDER_SITE_OTHER): Payer: Self-pay | Admitting: Primary Care

## 2024-02-29 NOTE — Telephone Encounter (Signed)
 Left VM with pt about their upcoming appt.

## 2024-03-01 ENCOUNTER — Ambulatory Visit (INDEPENDENT_AMBULATORY_CARE_PROVIDER_SITE_OTHER): Payer: 59 | Admitting: Primary Care

## 2024-05-09 ENCOUNTER — Telehealth (INDEPENDENT_AMBULATORY_CARE_PROVIDER_SITE_OTHER): Payer: Self-pay | Admitting: Primary Care

## 2024-05-09 NOTE — Telephone Encounter (Signed)
 Called pt to confirm appt. Pt will be present.

## 2024-05-15 ENCOUNTER — Telehealth (INDEPENDENT_AMBULATORY_CARE_PROVIDER_SITE_OTHER): Payer: Self-pay | Admitting: Primary Care

## 2024-05-15 NOTE — Telephone Encounter (Signed)
 Called pt to confirm appt. Pt will not be present.

## 2024-05-16 ENCOUNTER — Ambulatory Visit (INDEPENDENT_AMBULATORY_CARE_PROVIDER_SITE_OTHER): Admitting: Primary Care

## 2024-06-18 ENCOUNTER — Telehealth (INDEPENDENT_AMBULATORY_CARE_PROVIDER_SITE_OTHER): Payer: Self-pay | Admitting: Primary Care

## 2024-06-18 NOTE — Telephone Encounter (Signed)
 Spoke to pt about upcoming appt.. Will be present

## 2024-06-25 ENCOUNTER — Ambulatory Visit (INDEPENDENT_AMBULATORY_CARE_PROVIDER_SITE_OTHER): Payer: 59 | Admitting: Primary Care
